# Patient Record
Sex: Female | Born: 2000 | Race: Black or African American | Hispanic: No | Smoking: Never smoker
Health system: Southern US, Community
[De-identification: ages and names within clinical notes are randomized; demographics above are authoritative.]

## PROBLEM LIST (undated history)

## (undated) DIAGNOSIS — Z9889 Other specified postprocedural states: Secondary | ICD-10-CM

## (undated) DIAGNOSIS — I471 Supraventricular tachycardia, unspecified: Secondary | ICD-10-CM

## (undated) DIAGNOSIS — M419 Scoliosis, unspecified: Secondary | ICD-10-CM

## (undated) HISTORY — PX: CARDIAC SURGERY: SHX584

## (undated) HISTORY — PX: RADIOFREQUENCY ABLATION: SHX2290

## (undated) HISTORY — DX: Scoliosis, unspecified: M41.9

---
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
## 2020-05-22 ENCOUNTER — Ambulatory Visit (INDEPENDENT_AMBULATORY_CARE_PROVIDER_SITE_OTHER): Payer: Medicaid Other | Admitting: Pediatrics

## 2020-05-29 ENCOUNTER — Encounter (INDEPENDENT_AMBULATORY_CARE_PROVIDER_SITE_OTHER): Payer: Self-pay | Admitting: Pediatrics

## 2020-05-29 ENCOUNTER — Ambulatory Visit (INDEPENDENT_AMBULATORY_CARE_PROVIDER_SITE_OTHER): Payer: Medicaid Other | Admitting: Pediatrics

## 2020-05-29 ENCOUNTER — Other Ambulatory Visit: Payer: Self-pay

## 2020-05-29 VITALS — BP 110/66 | HR 84 | Ht 64.72 in | Wt 114.0 lb

## 2020-05-29 DIAGNOSIS — E161 Other hypoglycemia: Secondary | ICD-10-CM | POA: Diagnosis not present

## 2020-05-29 DIAGNOSIS — N898 Other specified noninflammatory disorders of vagina: Secondary | ICD-10-CM | POA: Diagnosis not present

## 2020-05-29 DIAGNOSIS — Z8349 Family history of other endocrine, nutritional and metabolic diseases: Secondary | ICD-10-CM | POA: Diagnosis not present

## 2020-05-29 LAB — POCT GLYCOSYLATED HEMOGLOBIN (HGB A1C): Hemoglobin A1C: 5.1 % (ref 4.0–5.6)

## 2020-05-29 LAB — POCT GLUCOSE (DEVICE FOR HOME USE): Glucose Fasting, POC: 98 mg/dL (ref 70–99)

## 2020-05-29 NOTE — Patient Instructions (Addendum)
It was a pleasure to see you in clinic today.   Feel free to contact our office during normal business hours at [phone] with questions or concerns. If you need [place] urgently after normal business hours, please call the above number to reach our answering service who will contact the on-call pediatric endocrinologist.  If you choose to communicate with [place] via MyChart, please do not send urgent messages as this inbox is NOT monitored on nights or weekends.  Urgent concerns should be discussed with the on-call pediatric endocrinologist.   Call if you start having episodes again.  Otherwise make sure to eat small meals throughout the day.

## 2020-05-29 NOTE — Progress Notes (Signed)
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 female presenting for follow-up of the above concerns.  She attended this visit alone.  1. Sandra [name] initially [facility] to [facility] ([facility]) in 09/2017 for concerns of hypoglycemia with a syncopal episode.  Sandra [name] had been evaluated in [facility] emergency room on 08/14/2017 after fainting at a family reunion after a prolonged fast (episode occurred in the afternoon and she had not eaten since the night before). Mom attempted to feed Sandra [name] before she passed out, though was unsuccessful. She was taken to the emergency room by private vehicle and came to after smelling ammonia. CBG in the emergency room was 74, glucose as part of CMP was 94. UA was negative for glucose with 5 of ketones. The syncopal event was attributed to hypoglycemia due to poor by mouth intake prior to the family reunion. Sandra [name] was seen by her PCP on 08/26/2017 where weight was documented as 106 pounds, height documented at 5 feet 3.2 inches and A1c recorded as 5.7%.  At her initial visit to [facility]), she was given a glucometer and diet changes were recommended including eating protein with each meal and not skipping meals.    2.Since last visit on 11/21/2019, Sandra [name] has been well.     Concerning episodes: -None.  Has been eating well. Eats all the time at work [employer])  Diet review:  -Eating all the time at work.  No problems.  Good appetite.  Weight: increased 4lb from last visit.  Plotting at 25th%.  Activity:  -getting lots of activity at work  Energy: good  Going to [employer] in the Fall to study business  Has menstrual concern today- passed a clot vaginally 05/22/2020, since then has had yellow discharge.  No odor, no pain, no dysuria.  No recent  antibotics.  Advised to call PCP for further evaluation.  ROS:  All systems reviewed with pertinent positives listed below; otherwise negative. CV: history of SVT s/p accessory tract ablation in 2015 (Dr. Loa Socks at [facility]). No cardiac issues  Past Medical History:  Past Medical History:  Diagnosis Date  . History of Holter monitoring 10/2016   normal  . Scoliosis   . SVT (supraventricular tachycardia) (HCC)     Meds: Outpatient Encounter Medications as of 05/29/2020  Medication Sig  . medroxyPROGESTERone (DEPO-PROVERA) 150 MG/ML injection Inject 150 mg into the muscle every 3 (three) months.  . Accu-Chek Softclix Lancets lancets Use as instructed (Patient not taking: Reported on 05/29/2020)  . glucose blood (ACCU-CHEK GUIDE) test strip Use to check BG when having symptoms 3 times a day (Patient not taking: Reported on 05/29/2020)  . ibuprofen (ADVIL,MOTRIN) 400 MG tablet Take 1 tablet (400 mg total) by mouth every 6 (six) hours as needed. (Patient not taking: Reported on 05/29/2020)   No facility-administered encounter medications on file as of 05/29/2020.    Allergies: No Known Allergies  Surgical History: Past Surgical History:  Procedure Laterality Date  . 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 RADIOFREQUENCY ABLATION      Family History:  Family History  Problem Relation Age of Onset  . Cancer Mother        Thyroid cancer s/p thyroidectomy, Dx 2015  . Irritable bowel syndrome Mother   . Thyroid disease Mother  Thyroid cancer s/p thyroidectomy, treated with levothyroxine  . Sickle cell trait Father   . Schizophrenia Maternal Grandmother   . Schizophrenia Maternal Grandfather   Mother and older sister have presumed hypoglycemic episodes (mother describes hers as reactive hypoglycemia)  Maternal great-grandmother had diabetes treated with insulin  Social History: Lives with: Mother and younger brother  Graduated from [employer].  Working  at [employer].  Attending [employer] in the fall  Physical Exam:  Vitals:   05/29/20 1455  BP: 110/66  Pulse: 84  Weight: 114 lb (51.7 kg)  Height: 5' 4.72" (1.644 m)   BP 110/66   Pulse 84   Ht 5' 4.72" (1.644 m)   Wt 114 lb (51.7 kg)   BMI 19.13 kg/m  Body mass index: body mass index is 19.13 kg/m. Blood pressure percentiles are not available for patients who are 18 years or older.  Wt Readings from Last 3 Encounters:  05/29/20 114 lb (51.7 kg) (27 %, Z= -0.63)*  11/21/19 110 lb (49.9 kg) (20 %, Z= -0.83)*  07/19/19 106 lb (48.1 kg) (14 %, Z= -1.07)*   * Growth percentiles are based on CDC (Girls, 2-20 Years) data.   Ht Readings from Last 3 Encounters:  05/29/20 5' 4.72" (1.644 m) (57 %, Z= 0.19)*  11/21/19 5' 4.37" (1.635 m) (52 %, Z= 0.06)*  07/19/19 5' 4.17" (1.63 m) (50 %, Z= -0.01)*   * Growth percentiles are based on CDC (Girls, 2-20 Years) data.   Body mass index is 19.13 kg/m.  27 %ile (Z= -0.63) based on CDC (Girls, 2-20 Years) weight-for-age data using vitals from 05/29/2020. 57 %ile (Z= 0.19) based on CDC (Girls, 2-20 Years) Stature-for-age data based on Stature recorded on 05/29/2020.  General: Well developed, well nourished female in no acute distress.  Appears stated age Head: Normocephalic, atraumatic.   Eyes:  Pupils equal and round. EOMI.   Sclera white.  No eye drainage.   Ears/Nose/Mouth/Throat: Masked   Neck: supple, no cervical lymphadenopathy, no thyromegaly Cardiovascular: regular rate, normal S1/S2, no murmurs Respiratory: No increased work of breathing.  Lungs clear to auscultation bilaterally.  No wheezes. Abdomen: soft, nontender, nondistended.  Extremities: warm, well perfused, cap refill < 2 sec.   Musculoskeletal: Normal muscle mass.  Normal strength Skin: warm, dry.  No rash or lesions. Neurologic: alert and oriented, normal speech, no tremor  Laboratory Evaluation:   Ref. Range 11/21/2019 10:32  TSH Latest Units: mIU/L 1.75   T4,Free(Direct) Latest Ref Range: 0.8 - 1.4 ng/dL 1.3  Thyroxine (T4) Latest Ref Range: 5.3 - 11.7 mcg/dL 12.0 (H)     Ref. Range 05/29/2020 14:59  Hemoglobin A1C Latest Ref Range: 4.0 - 5.6 % 5.1  Glucose Fasting, POC Latest Ref Range: 70 - 99 mg/dL 98    Assessment/Plan:  Sandra [name] is a 19 y.o. female with past episode of syncope felt related to ketotic hypoglycemia/prolonged fasting with family history of reactive hypoglycemia. She has been doing better eating more recently and no recent episodes concerning for hypoglycemia.  She has had weight gain since last visit and is doing well.    1. Ketotic hypoglycemia/ 2. Family history of hypoglycemia -Encouraged to eat small meals with protein/carbs throughout the day. -Check BG prn -Growth chart reviewed with family -Explained that A1c/fasting glucose today is normal.   3. Family history of thyroid disease/ -TFTs normal at last visit.  No sx today.   4. Vaginal discharge -Advised to go to PCP for evaluation  Follow-up:  Return if symptoms worsen or fail to improve.  Call to be seen if episodes return   >30 minutes spent today reviewing the medical chart, counseling the patient/family, and documenting today's encounter.  Casimiro Needle, MD

## 2020-06-04 ENCOUNTER — Telehealth: Payer: Self-pay | Admitting: Adult Health

## 2020-06-04 NOTE — Telephone Encounter (Signed)

## 2020-06-05 ENCOUNTER — Encounter: Payer: Self-pay | Admitting: Adult Health

## 2020-06-05 ENCOUNTER — Ambulatory Visit (INDEPENDENT_AMBULATORY_CARE_PROVIDER_SITE_OTHER): Payer: Medicaid Other | Admitting: Adult Health

## 2020-06-05 VITALS — BP 111/71 | HR 81 | Ht 65.0 in | Wt 116.0 lb

## 2020-06-05 DIAGNOSIS — Z113 Encounter for screening for infections with a predominantly sexual mode of transmission: Secondary | ICD-10-CM | POA: Diagnosis not present

## 2020-06-05 DIAGNOSIS — N760 Acute vaginitis: Secondary | ICD-10-CM

## 2020-06-05 DIAGNOSIS — N898 Other specified noninflammatory disorders of vagina: Secondary | ICD-10-CM | POA: Diagnosis not present

## 2020-06-05 DIAGNOSIS — B9689 Other specified bacterial agents as the cause of diseases classified elsewhere: Secondary | ICD-10-CM | POA: Insufficient documentation

## 2020-06-05 LAB — POCT WET PREP (WET MOUNT)
Clue Cells Wet Prep Whiff POC: NEGATIVE
WBC, Wet Prep HPF POC: POSITIVE

## 2020-06-05 MED ORDER — METRONIDAZOLE 500 MG PO TABS
500.0000 mg | ORAL_TABLET | Freq: Two times a day (BID) | ORAL | 0 refills | Status: DC
Start: 2020-06-05 — End: 2021-05-15

## 2020-06-05 NOTE — Patient Instructions (Signed)
Bacterial Vaginosis  Bacterial vaginosis is a vaginal infection that occurs when the normal balance of bacteria in the vagina is disrupted. It results from an overgrowth of certain bacteria. This is the most common vaginal infection among women ages 15-44. Because bacterial vaginosis increases your risk for STIs (sexually transmitted infections), getting treated can help reduce your risk for chlamydia, gonorrhea, herpes, and HIV (human immunodeficiency virus). Treatment is also important for preventing complications in pregnant women, because this condition can cause an early (premature) delivery. What are the causes? This condition is caused by an increase in harmful bacteria that are normally present in small amounts in the vagina. However, the reason that the condition develops is not fully understood. What increases the risk? The following factors may make you more likely to develop this condition:  Having a new sexual partner or multiple sexual partners.  Having unprotected sex.  Douching.  Having an intrauterine device (IUD).  Smoking.  Drug and alcohol abuse.  Taking certain antibiotic medicines.  Being pregnant. You cannot get bacterial vaginosis from toilet seats, bedding, swimming pools, or contact with objects around you. What are the signs or symptoms? Symptoms of this condition include:  Grey or white vaginal discharge. The discharge can also be watery or foamy.  A fish-like odor with discharge, especially after sexual intercourse or during menstruation.  Itching in and around the vagina.  Burning or pain with urination. Some women with bacterial vaginosis have no signs or symptoms. How is this diagnosed? This condition is diagnosed based on:  Your medical history.  A physical exam of the vagina.  Testing a sample of vaginal fluid under a microscope to look for a large amount of bad bacteria or abnormal cells. Your health care provider may use a cotton swab or  a small wooden spatula to collect the sample. How is this treated? This condition is treated with antibiotics. These may be given as a pill, a vaginal cream, or a medicine that is put into the vagina (suppository). If the condition comes back after treatment, a second round of antibiotics may be needed. Follow these instructions at home: Medicines  Take over-the-counter and prescription medicines only as told by your health care provider.  Take or use your antibiotic as told by your health care provider. Do not stop taking or using the antibiotic even if you start to feel better. General instructions  If you have a female sexual partner, tell her that you have a vaginal infection. She should see her health care provider and be treated if she has symptoms. If you have a female sexual partner, he does not need treatment.  During treatment: ? Avoid sexual activity until you finish treatment. ? Do not douche. ? Avoid alcohol as directed by your health care provider. ? Avoid breastfeeding as directed by your health care provider.  Drink enough water and fluids to keep your urine clear or pale yellow.  Keep the area around your vagina and rectum clean. ? Wash the area daily with warm water. ? Wipe yourself from front to back after using the toilet.  Keep all follow-up visits as told by your health care provider. This is important. How is this prevented?  Do not douche.  Wash the outside of your vagina with warm water only.  Use protection when having sex. This includes latex condoms and dental dams.  Limit how many sexual partners you have. To help prevent bacterial vaginosis, it is best to have sex with just one partner (  monogamous).  Make sure you and your sexual partner are tested for STIs.  Wear cotton or cotton-lined underwear.  Avoid wearing tight pants and pantyhose, especially during summer.  Limit the amount of alcohol that you drink.  Do not use any products that contain  nicotine or tobacco, such as cigarettes and e-cigarettes. If you need help quitting, ask your health care provider.  Do not use illegal drugs. Where to find more information  Centers for Disease Control and Prevention: www.cdc.gov/std  American Sexual Health Association (ASHA): www.ashastd.org  U.S. Department of Health and Human Services, Office on Women's Health: www.womenshealth.gov/ or https://www.womenshealth.gov/a-z-topics/bacterial-vaginosis Contact a health care provider if:  Your symptoms do not improve, even after treatment.  You have more discharge or pain when urinating.  You have a fever.  You have pain in your abdomen.  You have pain during sex.  You have vaginal bleeding between periods. Summary  Bacterial vaginosis is a vaginal infection that occurs when the normal balance of bacteria in the vagina is disrupted.  Because bacterial vaginosis increases your risk for STIs (sexually transmitted infections), getting treated can help reduce your risk for chlamydia, gonorrhea, herpes, and HIV (human immunodeficiency virus). Treatment is also important for preventing complications in pregnant women, because the condition can cause an early (premature) delivery.  This condition is treated with antibiotic medicines. These may be given as a pill, a vaginal cream, or a medicine that is put into the vagina (suppository). This information is not intended to replace advice given to you by your health care provider. Make sure you discuss any questions you have with your health care provider. Document Revised: 11/12/2017 Document Reviewed: 08/15/2016 Elsevier Patient Education  2020 Elsevier Inc.  

## 2020-06-05 NOTE — Progress Notes (Signed)
  Subjective:     Patient ID: Sandra [name], female   DOB: 2001-05-26, 19 y.o.   MRN: [id number]  HPI Flora is a 19 year old black female,single, G0P0, on depo, complaining of vaginal discharge.She does not have bleeding with depo, but passed a clot on 05/22/20 an has had vaginal discharge since, her last depo was 05/30/20, she got at [place], but wants to start getting depo here. She graduated from [employer] and is going to [employer] in the fall.  PCP is Terie Purser PA.  Review of Systems On depo, no periods, but passed clot 05/22/20 and has had vaginal discharge since Denies any itching or burning, no odor Not currently sexually active.  Reviewed past medical,surgical, social and family history. Reviewed medications and allergies.      Objective:   Physical Exam BP 111/71 (BP Location: Left Arm, Patient Position: Sitting, Cuff Size: Normal)   Pulse 81   Ht 5\' 5"  (1.651 m)   Wt 116 lb (52.6 kg)   BMI 19.30 kg/m  Skin warm and dry.  Lungs: clear to ausculation bilaterally. Cardiovascular: regular rate and rhythm. Pelvic: external genitalia is normal in appearance no lesions, vagina: copious greenish discharge without odor,urethra has no lesions or masses noted, cervix:smooth and nulliparous, uterus: normal size, shape and contour, non tender, no masses felt, adnexa: no masses or tenderness noted. Bladder is non tender and no masses felt. Wet prep: + for clue cells and +WBCs. Nuswab obtained AA 0 Fall risk is low PHQ 9 score is 0 Examination chaperoned by LPN    Assessment:     1. Vaginal discharge nuswab sent  2. BV (bacterial vaginosis) nuswab sent Will rx flagyl, no sex or alcohol during treatment  Meds ordered this encounter  Medications  . metroNIDAZOLE (FLAGYL) 500 MG tablet    Sig: Take 1 tablet (500 mg total) by mouth 2 (two) times daily.    Dispense:  14 tablet    Refill:  0    Order Specific Question:   Supervising Provider    Answer:   Faith Rogue [[id number]]  Review handout on BV  3. Screening examination for STD (sexually transmitted disease) Nuswab sent    Plan:     Depo due in September

## 2020-06-08 LAB — NUSWAB VAGINITIS PLUS (VG+)
Candida albicans, NAA: NEGATIVE
Candida glabrata, NAA: NEGATIVE
Chlamydia trachomatis, NAA: NEGATIVE
Neisseria gonorrhoeae, NAA: POSITIVE — AB
Trich vag by NAA: NEGATIVE

## 2020-06-10 ENCOUNTER — Telehealth: Payer: Self-pay | Admitting: Adult Health

## 2020-06-10 ENCOUNTER — Encounter: Payer: Self-pay | Admitting: Adult Health

## 2020-06-10 DIAGNOSIS — A549 Gonococcal infection, unspecified: Secondary | ICD-10-CM

## 2020-06-10 HISTORY — DX: Gonococcal infection, unspecified: A54.9

## 2020-06-10 NOTE — Telephone Encounter (Signed)
Left message with mom to have Brissia  call me

## 2020-06-10 NOTE — Telephone Encounter (Signed)
Pt aware nuswab +GC, to come in am at 9:30 for injection of Rocephin 500 mg,tell partner he needs to be treated, no sex and POC in 2 weeks or so, [facility] sent

## 2020-06-11 ENCOUNTER — Encounter: Payer: Self-pay | Admitting: *Deleted

## 2020-06-11 ENCOUNTER — Ambulatory Visit (INDEPENDENT_AMBULATORY_CARE_PROVIDER_SITE_OTHER): Payer: Medicaid Other | Admitting: *Deleted

## 2020-06-11 DIAGNOSIS — A549 Gonococcal infection, unspecified: Secondary | ICD-10-CM | POA: Diagnosis not present

## 2020-06-11 MED ORDER — CEFTRIAXONE SODIUM 250 MG IJ SOLR
500.0000 mg | Freq: Once | INTRAMUSCULAR | Status: AC
  Administered 2020-06-11: 10:00:00 500 mg via INTRAMUSCULAR

## 2020-06-11 MED ORDER — CEFTRIAXONE SODIUM 250 MG IJ SOLR: 250.0000 mg | Freq: Once | INTRAMUSCULAR | Status: DC

## 2020-06-11 NOTE — Progress Notes (Signed)
   NURSE VISIT- INJECTION  SUBJECTIVE:  Sandra [name] is a 19 y.o. G0P0000 female here for a Rocephin for treatment for gonorrhea. She is a GYN patient.   OBJECTIVE:  There were no vitals taken for this visit.  Appears well, in no apparent distress  Injection administered in: Right upper quad. gluteus  Meds ordered this encounter  Medications  . DISCONTD: cefTRIAXone (ROCEPHIN) injection 250 mg  . cefTRIAXone (ROCEPHIN) injection 500 mg    ASSESSMENT: GYN patient Rocephin for treatment for gonorrhea PLAN: Follow-up: as scheduled   Malachy Mood  06/11/2020 10:32 AM

## 2020-07-09 ENCOUNTER — Other Ambulatory Visit (INDEPENDENT_AMBULATORY_CARE_PROVIDER_SITE_OTHER): Payer: Medicaid Other | Admitting: *Deleted

## 2020-07-09 ENCOUNTER — Other Ambulatory Visit (HOSPITAL_COMMUNITY)
Admission: RE | Admit: 2020-07-09 | Discharge: 2020-07-09 | Disposition: A | Discharge status: Home / Self Care | Payer: Medicaid Other | Source: Ambulatory Visit | Attending: Obstetrics & Gynecology | Admitting: Obstetrics & Gynecology

## 2020-07-09 DIAGNOSIS — Z113 Encounter for screening for infections with a predominantly sexual mode of transmission: Secondary | ICD-10-CM | POA: Diagnosis not present

## 2020-07-09 DIAGNOSIS — A549 Gonococcal infection, unspecified: Secondary | ICD-10-CM

## 2020-07-09 NOTE — Progress Notes (Signed)
   NURSE VISIT- STD/POC  SUBJECTIVE:  Sandra [name] is a 19 y.o. G0P0000 GYN patientfemale here for a vaginal swab for STD screen.  She reports the following symptoms: none for 0 days. Denies abnormal vaginal bleeding, significant pelvic pain, fever, or UTI symptoms.  OBJECTIVE:  There were no vitals taken for this visit.  Appears well, in no apparent distress  ASSESSMENT: Vaginal swab for STD screen  PLAN: Self-collected vaginal probe for Gonorrhea, Chlamydia sent to lab Treatment: to be determined once results are received Follow-up as needed if symptoms persist/worsen, or new symptoms develop  Malachy Mood  07/09/2020 11:28 AM

## 2020-07-09 NOTE — Progress Notes (Signed)
Chart reviewed for nurse visit. Agree with plan of care.  Nejla Reasor A, NP 07/09/2020 12:56 PM  

## 2020-07-10 ENCOUNTER — Telehealth: Payer: Self-pay | Admitting: *Deleted

## 2020-07-10 NOTE — Telephone Encounter (Signed)
[facility] called to inform us that the swab needed to be recollected due to not having enough fluid with the sample.  Patient notified and patient will come tomorrow for recollection.

## 2020-07-11 ENCOUNTER — Other Ambulatory Visit (INDEPENDENT_AMBULATORY_CARE_PROVIDER_SITE_OTHER): Payer: Medicaid Other | Admitting: *Deleted

## 2020-07-11 ENCOUNTER — Other Ambulatory Visit (HOSPITAL_COMMUNITY)
Admission: RE | Admit: 2020-07-11 | Discharge: 2020-07-11 | Disposition: A | Discharge status: Home / Self Care | Payer: Medicaid Other | Source: Ambulatory Visit | Attending: Obstetrics and Gynecology | Admitting: Obstetrics and Gynecology

## 2020-07-11 DIAGNOSIS — A549 Gonococcal infection, unspecified: Secondary | ICD-10-CM | POA: Diagnosis present

## 2020-07-11 NOTE — Progress Notes (Signed)
Chart reviewed for nurse visit. Agree with plan of care.  Cyril Mourning A, NP 07/11/2020 1:25 PM

## 2020-07-11 NOTE — Progress Notes (Signed)
   NURSE VISIT- POC  SUBJECTIVE:  Sandra [name] is a 19 y.o. G0P0000 GYN patientfemale here for a vaginal swab for proof of cure after treatment for Gonorrhea. Initial recollection was unable to be tested with pathology due to lack of fluid in container.  She reports the following symptoms: none for 0 days. Denies abnormal vaginal bleeding, significant pelvic pain, fever, or UTI symptoms.  OBJECTIVE:  There were no vitals taken for this visit.  Appears well, in no apparent distress  ASSESSMENT: Vaginal swab for proof of cure after treatment for Gonorrhea  PLAN: Self-collected vaginal probe for Gonorrhea, Chlamydia, Trichomonas sent to lab Treatment: to be determined once results are received Follow-up as needed if symptoms persist/worsen, or new symptoms develop  Jobe Marker  07/11/2020 10:25 AM

## 2020-07-16 LAB — CERVICOVAGINAL ANCILLARY ONLY
Chlamydia: NEGATIVE
Comment: NEGATIVE
Comment: NEGATIVE
Comment: NORMAL
Neisseria Gonorrhea: NEGATIVE
Trichomonas: NEGATIVE

## 2020-07-31 ENCOUNTER — Other Ambulatory Visit: Payer: Self-pay

## 2020-07-31 ENCOUNTER — Ambulatory Visit (INDEPENDENT_AMBULATORY_CARE_PROVIDER_SITE_OTHER): Payer: Medicaid Other | Admitting: *Deleted

## 2020-07-31 DIAGNOSIS — Z3042 Encounter for surveillance of injectable contraceptive: Secondary | ICD-10-CM

## 2020-07-31 MED ORDER — MEDROXYPROGESTERONE ACETATE 150 MG/ML IM SUSP
150.0000 mg | Freq: Once | INTRAMUSCULAR | Status: AC
  Administered 2020-07-31: 150 mg via INTRAMUSCULAR

## 2020-07-31 NOTE — Progress Notes (Signed)
° °  NURSE VISIT- INJECTION  SUBJECTIVE:  Sandra [name] is a 19 y.o. G0P0000 female here for a Depo Provera for contraception/period management. She is a GYN patient.   OBJECTIVE:  There were no vitals taken for this visit.  Appears well, in no apparent distress  Injection administered in: Right upper quad. gluteus  Meds ordered this encounter  Medications   medroxyPROGESTERone (DEPO-PROVERA) injection 150 mg    ASSESSMENT: GYN patient Depo Provera for contraception/period management PLAN: Follow-up: in 11-13 weeks for next Depo   Sandra [name]  07/31/2020 10:26 AM

## 2020-08-26 ENCOUNTER — Encounter (HOSPITAL_COMMUNITY): Payer: Self-pay

## 2020-08-26 ENCOUNTER — Emergency Department (HOSPITAL_COMMUNITY)
Admission: EM | Admit: 2020-08-26 | Discharge: 2020-08-27 | Disposition: A | Discharge status: Home / Self Care | Payer: Medicaid Other | Attending: Emergency Medicine | Admitting: Emergency Medicine

## 2020-08-26 DIAGNOSIS — Z5321 Procedure and treatment not carried out due to patient leaving prior to being seen by health care provider: Secondary | ICD-10-CM | POA: Insufficient documentation

## 2020-08-26 DIAGNOSIS — R112 Nausea with vomiting, unspecified: Secondary | ICD-10-CM | POA: Insufficient documentation

## 2020-08-26 DIAGNOSIS — R55 Syncope and collapse: Secondary | ICD-10-CM | POA: Diagnosis not present

## 2020-08-26 LAB — CBC
HCT: 40.4 % (ref 36.0–46.0)
Hemoglobin: 13.5 g/dL (ref 12.0–15.0)
MCH: 29.2 pg (ref 26.0–34.0)
MCHC: 33.4 g/dL (ref 30.0–36.0)
MCV: 87.3 fL (ref 80.0–100.0)
Platelets: 221 10*3/uL (ref 150–400)
RBC: 4.63 MIL/uL (ref 3.87–5.11)
RDW: 13 % (ref 11.5–15.5)
WBC: 4.5 10*3/uL (ref 4.0–10.5)
nRBC: 0 % (ref 0.0–0.2)

## 2020-08-26 LAB — BASIC METABOLIC PANEL
Anion gap: 10 (ref 5–15)
BUN: 7 mg/dL (ref 6–20)
CO2: 24 mmol/L (ref 22–32)
Calcium: 9.6 mg/dL (ref 8.9–10.3)
Chloride: 105 mmol/L (ref 98–111)
Creatinine, Ser: 0.94 mg/dL (ref 0.44–1.00)
GFR calc Af Amer: >60 mL/min (ref >60)
GFR calc non Af Amer: >60 mL/min (ref >60)
Glucose, Bld: 86 mg/dL (ref 70–99)
Potassium: 3.6 mmol/L (ref 3.5–5.1)
Sodium: 139 mmol/L (ref 135–145)

## 2020-08-26 LAB — I-STAT BETA HCG BLOOD, ED (MC, WL, AP ONLY): I-stat hCG, quantitative: <5 m[IU]/mL (ref <5)

## 2020-08-26 NOTE — ED Triage Notes (Signed)
Pt arrives to ED via [facility] w/ c/o near syncope while at [facility]. Pt had episode of n/v, however, feels better on arrival to ED.

## 2020-08-26 NOTE — ED Notes (Signed)
Pt's mother wanting to take her to [facility] ED as that is closer to home and her son can be looked after rather than waiting outside.  Advised mother and patient of vitals and that we would recommend that she stay but if she chooses to leave to come back or call 911 if patient begins to feel worse.  Patient and mother voiced understanding and have chosen to leave at this time.

## 2020-08-27 ENCOUNTER — Telehealth (INDEPENDENT_AMBULATORY_CARE_PROVIDER_SITE_OTHER): Payer: Self-pay | Admitting: Pediatrics

## 2020-08-27 NOTE — Telephone Encounter (Signed)
Advised patient to contact on call provider, Dr. Fransico Michael, between 7-9:30 PM.   Thank you for involving clinical pharmacist/diabetes educator to assist in providing this patient's care.   Zachery Conch, PharmD, CPP

## 2020-08-27 NOTE — Telephone Encounter (Signed)
°  Who's calling (name and relationship to patient) : Domenica Fail (mom)  Best contact number: [phone]  Provider they see: Dr. Larinda Buttery  Reason for call: Mom states that patient's blood sugar and blood pressure have been getting too low - yesterday EMS had to come and check her out. Mom requests call back.    PRESCRIPTION REFILL ONLY  Name of prescription:  Pharmacy:

## 2020-10-23 ENCOUNTER — Other Ambulatory Visit: Payer: Self-pay

## 2020-10-23 ENCOUNTER — Ambulatory Visit (INDEPENDENT_AMBULATORY_CARE_PROVIDER_SITE_OTHER): Payer: Medicaid Other | Admitting: *Deleted

## 2020-10-23 DIAGNOSIS — Z3042 Encounter for surveillance of injectable contraceptive: Secondary | ICD-10-CM

## 2020-10-23 MED ORDER — MEDROXYPROGESTERONE ACETATE 150 MG/ML IM SUSP
150.0000 mg | Freq: Once | INTRAMUSCULAR | Status: AC
  Administered 2020-10-23: 150 mg via INTRAMUSCULAR

## 2020-10-23 NOTE — Progress Notes (Signed)
   NURSE VISIT- INJECTION  SUBJECTIVE:  Sandra [name] is a 19 y.o. G0P0000 female here for a Depo Provera for contraception/period management. She is a GYN patient.   OBJECTIVE:  There were no vitals taken for this visit.  Appears well, in no apparent distress  Injection administered in: Left upper quad. gluteus  No orders of the defined types were placed in this encounter.   ASSESSMENT: GYN patient Depo Provera for contraception/period management PLAN: Follow-up: in 11-13 weeks for next Depo   Annamarie Dawley  10/23/2020 10:33 AM

## 2021-01-08 ENCOUNTER — Ambulatory Visit (INDEPENDENT_AMBULATORY_CARE_PROVIDER_SITE_OTHER): Payer: Medicaid Other

## 2021-01-08 ENCOUNTER — Other Ambulatory Visit: Payer: Self-pay

## 2021-01-08 DIAGNOSIS — Z3042 Encounter for surveillance of injectable contraceptive: Secondary | ICD-10-CM

## 2021-01-08 MED ORDER — MEDROXYPROGESTERONE ACETATE 150 MG/ML IM SUSY: PREFILLED_SYRINGE | Freq: Once | INTRAMUSCULAR | Status: AC

## 2021-01-08 NOTE — Progress Notes (Signed)
   NURSE VISIT- INJECTION  SUBJECTIVE:  Sandra [name] is a 20 y.o. G0P0000 female here for a Depo Provera for contraception/period management. She is a GYN patient.   OBJECTIVE:  There were no vitals taken for this visit.  Appears well, in no apparent distress  Injection administered in: Right upper quad. gluteus  Meds ordered this encounter  Medications  . medroxyPROGESTERone Acetate SUSY    ASSESSMENT: GYN patient Depo Provera for contraception/period management PLAN: Follow-up: in 11-13 weeks for next Depo   Jolane Bankhead A Noelani Harbach  01/08/2021 10:15 AM

## 2021-04-02 ENCOUNTER — Ambulatory Visit (INDEPENDENT_AMBULATORY_CARE_PROVIDER_SITE_OTHER): Payer: Medicaid Other

## 2021-04-02 ENCOUNTER — Other Ambulatory Visit: Payer: Self-pay

## 2021-04-02 DIAGNOSIS — Z3042 Encounter for surveillance of injectable contraceptive: Secondary | ICD-10-CM | POA: Diagnosis not present

## 2021-04-02 MED ORDER — MEDROXYPROGESTERONE ACETATE 150 MG/ML IM SUSY: PREFILLED_SYRINGE | Freq: Once | INTRAMUSCULAR | Status: AC

## 2021-04-02 NOTE — Progress Notes (Signed)
   NURSE VISIT- INJECTION  SUBJECTIVE:  Sandra [name] is a 20 y.o. G0P0000 female here for a Depo Provera for contraception/period management. She is a GYN patient.   OBJECTIVE:  There were no vitals taken for this visit.  Appears well, in no apparent distress  Injection administered in: Right upper quad. gluteus  Meds ordered this encounter  Medications  . medroxyPROGESTERone Acetate SUSY    ASSESSMENT: GYN patient Depo Provera for contraception/period management PLAN: Follow-up: in 11-13 weeks for next Depo   Sandra [name] A Sandra [name]  04/02/2021 9:43 AM

## 2021-04-16 ENCOUNTER — Emergency Department (HOSPITAL_COMMUNITY)
Admission: EM | Admit: 2021-04-16 | Discharge: 2021-04-16 | Disposition: A | Discharge status: Home / Self Care | Payer: Medicaid Other | Attending: Emergency Medicine | Admitting: Emergency Medicine

## 2021-04-16 ENCOUNTER — Emergency Department (HOSPITAL_COMMUNITY): Payer: Medicaid Other

## 2021-04-16 ENCOUNTER — Encounter (HOSPITAL_COMMUNITY): Payer: Self-pay

## 2021-04-16 ENCOUNTER — Other Ambulatory Visit: Payer: Self-pay

## 2021-04-16 DIAGNOSIS — M542 Cervicalgia: Secondary | ICD-10-CM | POA: Diagnosis not present

## 2021-04-16 LAB — CBC WITH DIFFERENTIAL/PLATELET
Abs Immature Granulocytes: 0.02 10*3/uL (ref 0.00–0.07)
Basophils Absolute: 0 10*3/uL (ref 0.0–0.1)
Basophils Relative: 1 %
Eosinophils Absolute: 0.1 10*3/uL (ref 0.0–0.5)
Eosinophils Relative: 1 %
HCT: 42.6 % (ref 36.0–46.0)
Hemoglobin: 14 g/dL (ref 12.0–15.0)
Immature Granulocytes: 0 %
Lymphocytes Relative: 19 %
Lymphs Abs: 1.2 10*3/uL (ref 0.7–4.0)
MCH: 28.8 pg (ref 26.0–34.0)
MCHC: 32.9 g/dL (ref 30.0–36.0)
MCV: 87.7 fL (ref 80.0–100.0)
Monocytes Absolute: 0.6 10*3/uL (ref 0.1–1.0)
Monocytes Relative: 9 %
Neutro Abs: 4.3 10*3/uL (ref 1.7–7.7)
Neutrophils Relative %: 70 %
Platelets: 273 10*3/uL (ref 150–400)
RBC: 4.86 MIL/uL (ref 3.87–5.11)
RDW: 13 % (ref 11.5–15.5)
WBC: 6.1 10*3/uL (ref 4.0–10.5)
nRBC: 0 % (ref 0.0–0.2)

## 2021-04-16 LAB — COMPREHENSIVE METABOLIC PANEL
ALT: 26 U/L (ref 0–44)
AST: 26 U/L (ref 15–41)
Albumin: 4.7 g/dL (ref 3.5–5.0)
Alkaline Phosphatase: 55 U/L (ref 38–126)
Anion gap: 9 (ref 5–15)
BUN: 12 mg/dL (ref 6–20)
CO2: 22 mmol/L (ref 22–32)
Calcium: 9.5 mg/dL (ref 8.9–10.3)
Chloride: 106 mmol/L (ref 98–111)
Creatinine, Ser: 0.61 mg/dL (ref 0.44–1.00)
GFR, Estimated: >60 mL/min (ref >60)
Glucose, Bld: 94 mg/dL (ref 70–99)
Potassium: 4.3 mmol/L (ref 3.5–5.1)
Sodium: 137 mmol/L (ref 135–145)
Total Bilirubin: 0.6 mg/dL (ref 0.3–1.2)
Total Protein: 8.8 g/dL — ABNORMAL HIGH (ref 6.5–8.1)

## 2021-04-16 LAB — I-STAT BETA HCG BLOOD, ED (MC, WL, AP ONLY): I-stat hCG, quantitative: <5 m[IU]/mL (ref <5)

## 2021-04-16 LAB — D-DIMER, QUANTITATIVE: D-Dimer, Quant: 1.49 ug/mL-FEU — ABNORMAL HIGH (ref 0.00–0.50)

## 2021-04-16 MED ORDER — IOHEXOL 350 MG/ML SOLN
75.0000 mL | Freq: Once | INTRAVENOUS | Status: AC | PRN
  Administered 2021-04-16: 75 mL via INTRAVENOUS

## 2021-04-16 MED ORDER — IBUPROFEN 800 MG PO TABS: 800.0000 mg | ORAL_TABLET | Freq: Three times a day (TID) | ORAL | 0 refills | Status: DC | PRN

## 2021-04-16 NOTE — ED Triage Notes (Signed)
Patient presents to ED with complaints of neck pain since waking up this morning and states she had some left arm weakness. Pt states pain is not as bad as it was since waking up. Pt states hurting in left side of neck when she takes a deep breath.

## 2021-04-16 NOTE — Discharge Instructions (Signed)
Follow-up with your family doctor next week for recheck. 

## 2021-04-16 NOTE — ED Provider Notes (Signed)
[facility] EMERGENCY DEPARTMENT Provider Note   CSN: [id number] Arrival date & time: 04/16/21  8144     History Chief Complaint  Patient presents with  . Neck Pain    Sandra [name] is a 20 y.o. female.  Patient complains of left-sided neck pain.  The pain is worse when he takes a deep breath.  Mild discomfort with movement  The history is provided by medical records.  Neck Pain Pain location:  L side Quality:  Aching Pain radiates to:  Does not radiate Pain severity:  Mild Onset quality:  Sudden Timing:  Intermittent Progression:  Waxing and waning Chronicity:  New Context: not fall   Relieved by:  Nothing Associated symptoms: no chest pain and no headaches        Past Medical History:  Diagnosis Date  . GC (gonococcus infection) 06/10/2020  . History of Holter monitoring 10/2016   normal  . Scoliosis   . SVT (supraventricular tachycardia) [facility])     Patient Active Problem List   Diagnosis Date Noted  . GC (gonococcus infection) 06/10/2020  . BV (bacterial vaginosis) 06/05/2020  . Vaginal discharge 06/05/2020  . Screening examination for STD (sexually transmitted disease) 06/05/2020  . 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 Family history of hypoglycemia 09/29/2018  . SVT (supraventricular tachycardia) (HCC) 05/09/2015  . Palpitations 11/06/2014    Past Surgical History:  Procedure Laterality Date  . 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 RADIOFREQUENCY ABLATION       OB History    Gravida  0   Para  0   Term  0   Preterm  0   AB  0   Living  0     SAB  0   IAB  0   Ectopic  0   Multiple  0   Live Births  0           Family History  Problem Relation Age of Onset  . Cancer Mother        Thyroid cancer s/p thyroidectomy, Dx 2015  . Irritable bowel syndrome Mother   . Thyroid disease Mother        Thyroid cancer s/p thyroidectomy, treated with levothyroxine  . Sickle cell trait  Father   . Schizophrenia Maternal Grandmother   . Schizophrenia Maternal Grandfather     Social History   Tobacco Use  . Smoking status: Never Smoker  . Smokeless tobacco: Never Used  Vaping Use  . Vaping Use: Never used  Substance Use Topics  . Alcohol use: No  . Drug use: Yes    Types: Marijuana    Home Medications Prior to Admission medications   Medication Sig Start Date End Date Taking? Authorizing Provider  ibuprofen (ADVIL) 800 MG tablet Take 1 tablet (800 mg total) by mouth every 8 (eight) hours as needed for moderate pain. 04/16/21  Yes Bethann Berkshire, MD  medroxyPROGESTERone (DEPO-PROVERA) 150 MG/ML injection Inject 150 mg into the muscle every 3 (three) months.   Yes [provider]  metroNIDAZOLE (FLAGYL) 500 MG tablet Take 1 tablet (500 mg total) by mouth 2 (two) times daily. Patient not taking: Reported on 04/16/2021 06/05/20   Adline Potter, NP    Allergies    Patient has no known allergies.  Review of Systems   Review of Systems  Constitutional: Negative for appetite change and fatigue.  HENT: Negative for congestion, ear discharge and sinus  pressure.   Eyes: Negative for discharge.  Respiratory: Negative for cough.   Cardiovascular: Negative for chest pain.  Gastrointestinal: Negative for abdominal pain and diarrhea.  Genitourinary: Negative for frequency and hematuria.  Musculoskeletal: Positive for neck pain. Negative for back pain.  Skin: Negative for rash.  Neurological: Negative for seizures and headaches.  Psychiatric/Behavioral: Negative for hallucinations.    Physical Exam Updated Vital Signs BP 112/77 (BP Location: Right Arm)   Pulse 80   Temp 98.2 F (36.8 C) (Oral)   Resp 16   Ht 5\' 4"  (1.626 m)   Wt 56.7 kg   SpO2 100%   BMI 21.46 kg/m   Physical Exam Vitals and nursing note reviewed.  Constitutional:      Appearance: She is well-developed.  HENT:     Head: Normocephalic.     Nose: Nose normal.  Eyes:      General: No scleral icterus.    Conjunctiva/sclera: Conjunctivae normal.  Neck:     Thyroid: No thyromegaly.     Comments: Tender left lateral neck Cardiovascular:     Rate and Rhythm: Normal rate and regular rhythm.     Heart sounds: No murmur heard. No friction rub. No gallop.   Pulmonary:     Breath sounds: No stridor. No wheezing or rales.  Chest:     Chest wall: No tenderness.  Abdominal:     General: There is no distension.     Tenderness: There is no abdominal tenderness. There is no rebound.  Musculoskeletal:        General: Normal range of motion.     Cervical back: Neck supple.  Lymphadenopathy:     Cervical: No cervical adenopathy.  Skin:    Findings: No erythema or rash.  Neurological:     Mental Status: She is alert and oriented to person, place, and time.     Motor: No abnormal muscle tone.     Coordination: Coordination normal.  Psychiatric:        Behavior: Behavior normal.     ED Results / Procedures / Treatments   Labs (all labs ordered are listed, but only abnormal results are displayed) Labs Reviewed  COMPREHENSIVE METABOLIC PANEL - Abnormal; Notable for the following components:      Result Value   Total Protein 8.8 (*)    All other components within normal limits  D-DIMER, QUANTITATIVE - Abnormal; Notable for the following components:   D-Dimer, Quant 1.49 (*)    All other components within normal limits  CBC WITH DIFFERENTIAL/PLATELET  I-STAT BETA HCG BLOOD, ED ([facility], [facility], [facility] ONLY)    EKG None  Radiology DG Chest 2 View  Result Date: 04/16/2021 CLINICAL DATA:  Shortness of breath. Left-sided neck pain with radiation to left chest. EXAM: CHEST - 2 VIEW COMPARISON:  03/15/2010. FINDINGS: Mediastinum and hilar structures normal. Heart size normal. No focal infiltrate. No pleural effusion or pneumothorax. Thoracic spine scoliosis. IMPRESSION: No acute cardiopulmonary disease. Electronically Signed   By: 05/15/2010  Register   On: 04/16/2021 08:03   CT  Angio Chest PE W and/or Wo Contrast  Result Date: 04/16/2021 CLINICAL DATA:  High probability for pulmonary embolism EXAM: CT ANGIOGRAPHY CHEST WITH CONTRAST TECHNIQUE: Multidetector CT imaging of the chest was performed using the standard protocol during bolus administration of intravenous contrast. Multiplanar CT image reconstructions and MIPs were obtained to evaluate the vascular anatomy. CONTRAST:  109mL OMNIPAQUE IOHEXOL 350 MG/ML SOLN COMPARISON:  None. FINDINGS: Cardiovascular: Satisfactory opacification of the pulmonary arteries  to the segmental level. No evidence of pulmonary embolism. Normal heart size. No pericardial effusion. Mediastinum/Nodes: No mediastinal adenopathy or mass. Symmetric prominence of axillary lymph nodes with benign/reactive pattern. Lungs/Pleura: Mild atelectasis along the left heart border. Upper Abdomen: Negative Musculoskeletal: Pectus excavatum and mild scoliosis. No acute or aggressive finding. Review of the MIP images confirms the above findings. IMPRESSION: 1. Negative for pulmonary embolism. 2. Retrocardiac atelectasis. Electronically Signed   By: Marnee Spring M.D.   On: 04/16/2021 10:41    Procedures Procedures   Medications Ordered in ED Medications  iohexol (OMNIPAQUE) 350 MG/ML injection 75 mL (75 mLs Intravenous Contrast Given 04/16/21 0959)    ED Course  I have reviewed the triage vital signs and the nursing notes.  Pertinent labs & imaging results that were available during my care of the patient were reviewed by me and considered in my medical decision making (see chart for details). Labs unremarkable except for elevated D-dimer.  CT angio was negative.  Suspect musculoskeletal discomfort and left neck   MDM Rules/Calculators/A&P                         Patient with left-sided neck pain most likely from musculoskeletal discomfort.  She given Motrin will follow-up Final Clinical Impression(s) / ED Diagnoses Final diagnoses:  Neck pain    Rx /  DC Orders ED Discharge Orders         Ordered    ibuprofen (ADVIL) 800 MG tablet  Every 8 hours PRN        04/16/21 1152           Bethann Berkshire, MD 04/16/21 1157

## 2021-05-05 ENCOUNTER — Other Ambulatory Visit: Payer: Self-pay | Admitting: Advanced Practice Midwife

## 2021-05-05 ENCOUNTER — Telehealth: Payer: Self-pay | Admitting: Advanced Practice Midwife

## 2021-05-05 MED ORDER — MEGESTROL ACETATE 40 MG PO TABS: ORAL_TABLET | ORAL | 3 refills | Status: DC

## 2021-05-05 MED ORDER — NAPROXEN 500 MG PO TABS: 500.0000 mg | ORAL_TABLET | Freq: Two times a day (BID) | ORAL | 1 refills | Status: DC

## 2021-05-05 NOTE — Progress Notes (Signed)
On depo, started bleeding heavy Sat. Mom had rx for megace, took 3 last night. Slowed some, but not enough. Will continue Megace for (probably BTB on depo), rx naproxen. Appt for later in week in case not working

## 2021-05-05 NOTE — Telephone Encounter (Signed)
Pt's mom called, states she has several questions for Victorino Dike, explained that Victorino Dike is out of the office this afternoon so she asked that a message be sent to Drenda Freeze  No details were given  We received a fax from the after hours nurse line that they called during lunch, concerned with pt is on depo & started bleeding 2 days ago & never gets her cycle - very concerned  Please advise & call pt's mom (we do have a DPR on file)

## 2021-05-05 NOTE — Telephone Encounter (Signed)
Mother states her daughter Sandra [name] has been bleeding since Saturday and passing clots. She is having cramping and taking Ibuprofen but it is not helping.  She normally does not have bleeding while she is on the depo and is very concerned that she is bleeding. Patient has needed to leave work due to the bleeding and cramping.  Requested to speak to Victorino Dike but advised she was out of the office.  Requested then to speak with Drenda Freeze.

## 2021-05-07 ENCOUNTER — Ambulatory Visit (INDEPENDENT_AMBULATORY_CARE_PROVIDER_SITE_OTHER): Payer: Medicaid Other | Admitting: Pediatrics

## 2021-05-07 NOTE — Progress Notes (Deleted)
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 female presenting for follow-up of the above concerns.  She attended this visit ***alone.  1. [name] in 09/2017 for concerns of hypoglycemia with a syncopal episode.  Sandra [name] had been evaluated in [facility] emergency room on 08/14/2017 after fainting at a family reunion after a prolonged fast (episode occurred in the afternoon and she had not eaten since the night before). Mom attempted to feed Sandra [name] before she passed out, though was unsuccessful. She was taken to the emergency room by private vehicle and came to after smelling ammonia. CBG in the emergency room was 74, glucose as part of CMP was 94. UA was negative for glucose with 5 of ketones. The syncopal event was attributed to hypoglycemia due to poor by mouth intake prior to the family reunion. [name] was seen by her PCP on 08/26/2017 where weight was documented as 106 pounds, height documented at 5 feet 3.2 inches and A1c recorded as 5.7%.  At her initial visit to [facility] ([facility]), she was given a glucometer and diet changes were recommended including eating protein with each meal and not skipping meals.    2.Since last visit on 05/29/20, Sandra [name] has been well.     Concerning episodes: -***  Diet review:  -*** Weight: increased ***lb from last visit.  Plotting at ***% (was 25th% at last visit).  Activity:  -***  Energy: ***   ROS:  All systems reviewed with pertinent positives listed below; otherwise negative. CV: history of SVT s/p accessory tract ablation in 2015 (Dr. Loa Socks at [facility]). No ***recent cardiac issues  Past Medical History:  Past Medical History:  Diagnosis Date  . GC (gonococcus infection) 06/10/2020  . History of Holter  monitoring 10/2016   normal  . Scoliosis   . SVT (supraventricular tachycardia) (HCC)     Meds: Outpatient Encounter Medications as of 05/07/2021  Medication Sig  . ibuprofen (ADVIL) 800 MG tablet Take 1 tablet (800 mg total) by mouth every 8 (eight) hours as needed for moderate pain.  . medroxyPROGESTERone (DEPO-PROVERA) 150 MG/ML injection Inject 150 mg into the muscle every 3 (three) months.  . megestrol (MEGACE) 40 MG tablet Take 3/day (at the same time) for 5 days; 2/day for 5 days, then 1/day PO prn bleeding  . metroNIDAZOLE (FLAGYL) 500 MG tablet Take 1 tablet (500 mg total) by mouth 2 (two) times daily. (Patient not taking: Reported on 04/16/2021)  . naproxen (NAPROSYN) 500 MG tablet Take 1 tablet (500 mg total) by mouth 2 (two) times daily with a meal.   No facility-administered encounter medications on file as of 05/07/2021.    Allergies: No Known Allergies  Surgical History: Past Surgical History:  Procedure Laterality Date  . 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 RADIOFREQUENCY ABLATION      Family History:  Family History  Problem Relation Age of Onset  . Cancer Mother        Thyroid cancer s/p thyroidectomy, Dx 2015  . Irritable bowel syndrome Mother   . Thyroid disease Mother        Thyroid cancer s/p thyroidectomy, treated with levothyroxine  . Sickle cell trait Father   . Schizophrenia Maternal Grandmother   . Schizophrenia Maternal Grandfather   Mother and  older sister have presumed hypoglycemic episodes (mother describes hers as reactive hypoglycemia)  Maternal great-grandmother had diabetes treated with insulin  Social History: Lives with: Mother and younger brother  Attending [employer] ***  Physical Exam:  There were no vitals filed for this visit. There were no vitals taken for this visit. Body mass index: body mass index is unknown because there is no height or weight on file. Blood pressure percentiles  are not available for patients who are 18 years or older.  Wt Readings from Last 3 Encounters:  04/16/21 125 lb (56.7 kg) (46 %, Z= -0.11)*  06/05/20 116 lb (52.6 kg) (31 %, Z= -0.50)*  05/29/20 114 lb (51.7 kg) (27 %, Z= -0.63)*   * Growth percentiles are based on CDC (Girls, 2-20 Years) data.   Ht Readings from Last 3 Encounters:  04/16/21 5\' 4"  (1.626 m) (46 %, Z= -0.11)*  06/05/20 5\' 5"  (1.651 m) (62 %, Z= 0.29)*  05/29/20 5' 4.72" (1.644 m) (57 %, Z= 0.19)*   * Growth percentiles are based on CDC (Girls, 2-20 Years) data.   There is no height or weight on file to calculate BMI.  No weight on file for this encounter. No height on file for this encounter.  General: Well developed, well nourished ***female in no acute distress.  Appears *** stated age Head: Normocephalic, atraumatic.   Eyes:  Pupils equal and round. EOMI.   Sclera white.  No eye drainage.   Ears/Nose/Mouth/Throat: Masked Neck: supple, no cervical lymphadenopathy, no thyromegaly Cardiovascular: regular rate, normal S1/S2, no murmurs Respiratory: No increased work of breathing.  Lungs clear to auscultation bilaterally.  No wheezes. Abdomen: soft, nontender, nondistended.  Extremities: warm, well perfused, cap refill < 2 sec.   Musculoskeletal: Normal muscle mass.  Normal strength Skin: warm, dry.  No rash or lesions. Neurologic: alert and oriented, normal speech, no tremor   Laboratory Evaluation:   Ref. Range 11/21/2019 10:32  TSH Latest Units: mIU/L 1.75  T4,Free(Direct) Latest Ref Range: 0.8 - 1.4 ng/dL 1.3  Thyroxine (T4) Latest Ref Range: 5.3 - 11.7 mcg/dL 05/31/20 (H)     Ref. Range 05/29/2020 14:59  Hemoglobin A1C Latest Ref Range: 4.0 - 5.6 % 5.1  Glucose Fasting, POC Latest Ref Range: 70 - 99 mg/dL 98   Results for orders placed or performed during the hospital encounter of 04/16/21  CBC with Differential/Platelet  Result Value Ref Range   WBC 6.1 4.0 - 10.5 K/uL   RBC 4.86 3.87 - 5.11 MIL/uL    Hemoglobin 14.0 12.0 - 15.0 g/dL   HCT 05/31/2020 06/16/21 - 78.9 %   MCV 87.7 80.0 - 100.0 fL   MCH 28.8 26.0 - 34.0 pg   MCHC 32.9 30.0 - 36.0 g/dL   RDW 38.1 01.7 - 51.0 %   Platelets 273 150 - 400 K/uL   nRBC 0.0 0.0 - 0.2 %   Neutrophils Relative % 70 %   Neutro Abs 4.3 1.7 - 7.7 K/uL   Lymphocytes Relative 19 %   Lymphs Abs 1.2 0.7 - 4.0 K/uL   Monocytes Relative 9 %   Monocytes Absolute 0.6 0.1 - 1.0 K/uL   Eosinophils Relative 1 %   Eosinophils Absolute 0.1 0.0 - 0.5 K/uL   Basophils Relative 1 %   Basophils Absolute 0.0 0.0 - 0.1 K/uL   Immature Granulocytes 0 %   Abs Immature Granulocytes 0.02 0.00 - 0.07 K/uL  Comprehensive metabolic panel  Result Value Ref Range   Sodium 137  135 - 145 mmol/L   Potassium 4.3 3.5 - 5.1 mmol/L   Chloride 106 98 - 111 mmol/L   CO2 22 22 - 32 mmol/L   Glucose, Bld 94 70 - 99 mg/dL   BUN 12 6 - 20 mg/dL   Creatinine, Ser 0.98 0.44 - 1.00 mg/dL   Calcium 9.5 8.9 - 11.9 mg/dL   Total Protein 8.8 (H) 6.5 - 8.1 g/dL   Albumin 4.7 3.5 - 5.0 g/dL   AST 26 15 - 41 U/L   ALT 26 0 - 44 U/L   Alkaline Phosphatase 55 38 - 126 U/L   Total Bilirubin 0.6 0.3 - 1.2 mg/dL   GFR, Estimated >14 >78 mL/min   Anion gap 9 5 - 15  D-dimer, quantitative  Result Value Ref Range   D-Dimer, Quant 1.49 (H) 0.00 - 0.50 ug/mL-FEU  I-Stat Beta hCG blood, ED ([facility], [facility], [facility] only)  Result Value Ref Range   I-stat hCG, quantitative <5.0 <5 mIU/mL   Comment 3             Assessment/Plan:  ROSALIND GUIDO is a 20 y.o. female with past episode of syncope felt related to ketotic hypoglycemia/prolonged fasting with family history of reactive hypoglycemia. She has been doing b***  1. Ketotic hypoglycemia/ 2. Family history of hypoglycemia -Encouraged to eat small meals with protein/carbs throughout the day. -Check BG prn -Growth chart reviewed with family -Explained that A1c/fasting glucose today is normal.   3. Family history of thyroid disease/ -TFTs normal at last  visit.  No sx today.   4. Vaginal discharge -Advised to go to PCP for evaluation  Follow-up:   No follow-ups on file.  Call to be seen if episodes return   ***  Casimiro Needle, MD

## 2021-05-15 ENCOUNTER — Encounter: Payer: Self-pay | Admitting: Adult Health

## 2021-05-15 ENCOUNTER — Other Ambulatory Visit: Payer: Self-pay

## 2021-05-15 ENCOUNTER — Ambulatory Visit (INDEPENDENT_AMBULATORY_CARE_PROVIDER_SITE_OTHER): Payer: Medicaid Other | Admitting: Adult Health

## 2021-05-15 VITALS — BP 115/68 | HR 84 | Ht 64.0 in | Wt 123.2 lb

## 2021-05-15 DIAGNOSIS — N921 Excessive and frequent menstruation with irregular cycle: Secondary | ICD-10-CM | POA: Insufficient documentation

## 2021-05-15 MED ORDER — MEGESTROL ACETATE 20 MG PO TABS: ORAL_TABLET | ORAL | 1 refills | Status: DC

## 2021-05-15 NOTE — Progress Notes (Signed)
  Subjective:     Patient ID: Sandra [name], female   DOB: 23-Jul-2001, 20 y.o.   MRN: [id number]  HPI Sandra [name] is a 20 year old black female, single, G0P0, in for follow up on having bleeding with depo and took mom's megace and it stopped, would like rx for megace to have on hand, if bleeding returns. PCP is Terie Purser PA  Review of Systems BTB with depo Reviewed past medical,surgical, social and family history. Reviewed medications and allergies.     Objective:   Physical Exam BP 115/68 (BP Location: Right Arm, Patient Position: Sitting, Cuff Size: Normal)   Pulse 84   Ht 5\' 4"  (1.626 m)   Wt 123 lb 3.2 oz (55.9 kg)   LMP 05/03/2021 (Exact Date)   BMI 21.15 kg/m  Skin warm and dry. Lungs: clear to ausculation bilaterally. Cardiovascular: regular rate and rhythm.     Upstream - 05/15/21 1526      Pregnancy Intention Screening   Does the patient want to become pregnant in the next year? No    Does the patient's partner want to become pregnant in the next year? No    Would the patient like to discuss contraceptive options today? No      Contraception Wrap Up   Current Method Hormonal Injection    End Method Hormonal Injection    Contraception Counseling Provided No          Assessment:     1. Irregular intermenstrual bleeding Will rx megace  Meds ordered this encounter  Medications  . megestrol (MEGACE) 20 MG tablet    Sig: Take 4 bid if bleeding    Dispense:  40 tablet    Refill:  1    Order Specific Question:   Supervising Provider    Answer:   07/15/21 [[id number]]      Plan:     Has depo appt in July

## 2021-06-25 ENCOUNTER — Ambulatory Visit: Payer: Medicaid Other

## 2021-07-30 NOTE — Progress Notes (Signed)
CARDIOLOGY CONSULT NOTE       Patient ID: Sandra [name] MRN: [id number] DOB/AGE: 02/07/2001 19 y.o.  Admit date: (Not on file) Referring Physician: Jean Rosenthal Primary Physician: Avis Epley, PA-C Primary Cardiologist: New Reason for Consultation: SVT/Syncope  Active Problems:   * No active hospital problems. *   HPI:  20 y.o. referred by Terie Purser for SVT and syncope.  08/14/17 patient had "syncope" after prolonged fast at family reunion Came to after smelling ammonia. CBG in ER was 74. ? Thought to be from ketotic hypoglycemia. 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 involved slow pathway modification She had a normal ECG post procedure and no evidence of pre excitation TTE post ablation normal no structural heart disease and EF 80% 09/12/15 had f/u holter that was normal with no recurrence of atrial tachycardia or AV block Primary symptom had been palpitations and not syncope prior to ablation   Primary note from 07/21/21 indicates 3 brief episodes of dizziness and weakness in heat quickly resolved when sitting Not associated with palpitations, chest pain or dyspnea TSH and A1c normal   She clearly indicates her episodes do not involve palpitations and she starts to see dark spots With heat like a vagal prodrome   She smokes marijuana but no other drugs or ETOH. No bulemia/anorexia   ROS All other systems reviewed and negative except as noted above  Past Medical History:  Diagnosis Date   GC (gonococcus infection) 06/10/2020   History of Holter monitoring 10/2016   normal   Scoliosis    SVT (supraventricular tachycardia) (HCC)     Family History  Problem Relation Age of Onset   Cancer Mother        Thyroid cancer s/p thyroidectomy, Dx 2015   Irritable bowel syndrome Mother    Thyroid disease Mother        Thyroid cancer s/p thyroidectomy, treated with levothyroxine   Sickle cell  trait Father    Schizophrenia Maternal Grandmother    Schizophrenia Maternal Grandfather     Social History   Socioeconomic History   Marital status: Single    Spouse name: Not on file   Number of children: Not on file   Years of education: Not on file   Highest education level: Not on file  Occupational History   Not on file  Tobacco Use   Smoking status: Never   Smokeless tobacco: Never  Vaping Use   Vaping Use: Never used  Substance and Sexual Activity   Alcohol use: No   Drug use: Yes    Types: Marijuana   Sexual activity: Not Currently    Birth control/protection: Injection  Other Topics Concern   Not on file  Social History Narrative   Lives at home with mom and brother attends [employer] is in the 10th grade.    Social Determinants of Health   Financial Resource Strain: Not on file  Food Insecurity: Not on file  Transportation Needs: Not on file  Physical Activity: Not on file  Stress: Not on file  Social Connections: Not on file  Intimate Partner Violence: Not on file    Past Surgical History:  Procedure Laterality Date   CARDIAC SURGERY     pt reports "i had my extra fiber of my heart removed."   RADIOFREQUENCY ABLATION        Current Outpatient Medications:    cetirizine (ZYRTEC) 10 MG tablet, Take  10 mg by mouth as needed for allergies., Disp: , Rfl:    ibuprofen (ADVIL) 800 MG tablet, Take 1 tablet (800 mg total) by mouth every 8 (eight) hours as needed for moderate pain., Disp: 21 tablet, Rfl: 0   medroxyPROGESTERone (DEPO-PROVERA) 150 MG/ML injection, Inject 150 mg into the muscle every 3 (three) months., Disp: , Rfl:    megestrol (MEGACE) 20 MG tablet, Take 20 mg by mouth as needed. If bleeding, Disp: , Rfl:    naproxen (NAPROSYN) 500 MG tablet, Take 500 mg by mouth 2 (two) times daily as needed for moderate pain (or if bleeding)., Disp: , Rfl:     Physical Exam: Blood pressure (!) 100/50, pulse 66, height 5\' 4"  (1.626 m), weight 53.4 kg,  SpO2 93 %.    Affect appropriate Healthy:  appears stated age HEENT: normal Neck supple with no adenopathy JVP normal no bruits no thyromegaly Lungs clear with no wheezing and good diaphragmatic motion Heart:  S1/S2 no murmur, no rub, gallop or click PMI normal Abdomen: benighn, BS positve, no tenderness, no AAA no bruit.  No HSM or HJR Distal pulses intact with no bruits No edema Neuro non-focal Skin warm and dry Scoliosis  Postural signs negative 100 mmhg sitting and standing for 3 minutes Pulse steady in 70's no POTS    Labs:   Lab Results  Component Value Date   WBC 6.1 04/16/2021   HGB 14.0 04/16/2021   HCT 42.6 04/16/2021   MCV 87.7 04/16/2021   PLT 273 04/16/2021   No results for input(s): NA, K, CL, CO2, BUN, CREATININE, CALCIUM, PROT, BILITOT, ALKPHOS, ALT, AST, GLUCOSE in the last 168 hours.  Invalid input(s): LABALBU No results found for: CKTOTAL, CKMB, CKMBINDEX, TROPONINI No results found for: CHOL No results found for: HDL No results found for: LDLCALC No results found for: TRIG No results found for: CHOLHDL No results found for: LDLDIRECT    Radiology: No results found.  EKG: sR rate 78 ICRBBB LAE 09/04/20    ASSESSMENT AND PLAN:   SVT:  post ablation with slow pathway modification 2016.  No apparent recurrence ECG today ICRBBB SR no pathway stable Since episodes not related to palpitations and doubt bradycardia will defer monitor  Syncope:  previous w/u thought to be from reactive hypoglycemia but sound like simple vagal episodes in thin/low weight female with baseline low BP.  Encouraged salt intake and some weight gain will check echo and refer to EP Doubt that tilt table testing or ILR would be indicated  Birth Control:  Depo Provera injections q 3 months   Echo F/U EP   Signed: 2017 08/01/2021, 2:41 PM

## 2021-08-01 ENCOUNTER — Other Ambulatory Visit: Payer: Self-pay

## 2021-08-01 ENCOUNTER — Ambulatory Visit (INDEPENDENT_AMBULATORY_CARE_PROVIDER_SITE_OTHER): Payer: Medicaid Other | Admitting: Cardiovascular Disease

## 2021-08-01 ENCOUNTER — Encounter: Payer: Self-pay | Admitting: Cardiovascular Disease

## 2021-08-01 ENCOUNTER — Encounter: Payer: Self-pay | Admitting: *Deleted

## 2021-08-01 VITALS — BP 100/50 | HR 66 | Ht 64.0 in | Wt 117.8 lb

## 2021-08-01 DIAGNOSIS — R55 Syncope and collapse: Secondary | ICD-10-CM | POA: Diagnosis not present

## 2021-08-01 NOTE — Patient Instructions (Signed)
Medication Instructions:  Your physician recommends that you continue on your current medications as directed. Please refer to the Current Medication list given to you today.  *If you need a refill on your cardiac medications before your next appointment, please call your pharmacy*   Lab Work: NONE   If you have labs (blood work) drawn today and your tests are completely normal, you will receive your results only by: MyChart Message (if you have MyChart) OR A paper copy in the mail If you have any lab test that is abnormal or we need to change your treatment, we will call you to review the results.   Testing/Procedures: Your physician has requested that you have an echocardiogram. Echocardiography is a painless test that uses sound waves to create images of your heart. It provides your doctor with information about the size and shape of your heart and how well your heart's chambers and valves are working. This procedure takes approximately one hour. There are no restrictions for this procedure.    Follow-Up: At [facility], you and your health needs are our priority.  As part of our continuing mission to provide you with exceptional heart care, we have created designated Provider Care Teams.  These Care Teams include your primary Cardiologist (physician) and Advanced Practice Providers (APPs -  Physician Assistants and Nurse Practitioners) who all work together to provide you with the care you need, when you need it.  We recommend signing up for the patient portal called "MyChart".  Sign up information is provided on this After Visit Summary.  MyChart is used to connect with patients for Virtual Visits (Telemedicine).  Patients are able to view lab/test results, encounter notes, upcoming appointments, etc.  Non-urgent messages can be sent to your provider as well.   To learn more about what you can do with MyChart, go to [email or web address].    Your next appointment:    Next  available   The format for your next appointment:   In Person  Provider:   Lewayne Bunting, MD   Other Instructions Thank you for choosing [facility]!

## 2021-08-20 ENCOUNTER — Ambulatory Visit: Payer: Medicaid Other | Admitting: Internal Medicine

## 2021-08-26 ENCOUNTER — Other Ambulatory Visit: Payer: Self-pay

## 2021-08-26 ENCOUNTER — Ambulatory Visit (HOSPITAL_COMMUNITY)
Admission: RE | Admit: 2021-08-26 | Discharge: 2021-08-26 | Disposition: A | Discharge status: Home / Self Care | Payer: Medicaid Other | Source: Ambulatory Visit | Attending: Cardiovascular Disease | Admitting: Cardiovascular Disease

## 2021-08-26 DIAGNOSIS — R55 Syncope and collapse: Secondary | ICD-10-CM | POA: Diagnosis present

## 2021-08-26 LAB — ECHOCARDIOGRAM COMPLETE
Area-P 1/2: 3.53 cm2
S' Lateral: 2.58 cm

## 2021-08-26 NOTE — Progress Notes (Signed)
*  PRELIMINARY RESULTS* Echocardiogram 2D Echocardiogram has been performed.  Stacey Drain 08/26/2021, 2:57 PM

## 2021-09-02 ENCOUNTER — Ambulatory Visit (INDEPENDENT_AMBULATORY_CARE_PROVIDER_SITE_OTHER): Payer: Medicaid Other | Admitting: Internal Medicine

## 2021-09-02 ENCOUNTER — Other Ambulatory Visit: Payer: Self-pay

## 2021-09-02 ENCOUNTER — Encounter: Payer: Self-pay | Admitting: Internal Medicine

## 2021-09-02 VITALS — BP 105/65 | HR 83 | Ht 64.0 in | Wt 119.0 lb

## 2021-09-02 DIAGNOSIS — R55 Syncope and collapse: Secondary | ICD-10-CM

## 2021-09-02 NOTE — Progress Notes (Signed)
HPI Sandra [name] is referred today by Dr. Eden Emms for evaluation of syncope. She is a pleasant 20 yo woman with a h/o SVT who underwent EP study and catheter ablation at [facility] 6 years ago. She had a successful slow pathway modification. Before her ablation she notes that she could feel her heart race and at times she would pass out. She did well since then until May when she had recurrent episodes of syncope. This occurred while at work. She felt suddenly warm and became dizzy and lightheaded and passed out. She had several episodes that day but has not had any since. Dr. Eden Emms documented that she had missed a meal but she denies that to me. She has not had symptoms of SVT.  No Known Allergies   Current Outpatient Medications  Medication Sig Dispense Refill   cetirizine (ZYRTEC) 10 MG tablet Take 10 mg by mouth as needed for allergies.     ibuprofen (ADVIL) 800 MG tablet Take 1 tablet (800 mg total) by mouth every 8 (eight) hours as needed for moderate pain. 21 tablet 0   medroxyPROGESTERone (DEPO-PROVERA) 150 MG/ML injection Inject 150 mg into the muscle every 3 (three) months.     megestrol (MEGACE) 20 MG tablet Take 20 mg by mouth as needed. If bleeding     naproxen (NAPROSYN) 500 MG tablet Take 500 mg by mouth 2 (two) times daily as needed for moderate pain (or if bleeding).     No current facility-administered medications for this visit.     Past Medical History:  Diagnosis Date   GC (gonococcus infection) 06/10/2020   History of Holter monitoring 10/2016   normal   Scoliosis    SVT (supraventricular tachycardia) (HCC)     ROS:   All systems reviewed and negative except as noted in the HPI.   Past Surgical History:  Procedure Laterality Date   CARDIAC SURGERY     pt reports "i had my extra fiber of my heart removed."   RADIOFREQUENCY ABLATION       Family History  Problem Relation Age of Onset   Cancer Mother        Thyroid cancer s/p thyroidectomy, Dx 2015    Irritable bowel syndrome Mother    Thyroid disease Mother        Thyroid cancer s/p thyroidectomy, treated with levothyroxine   Sickle cell trait Father    Schizophrenia Maternal Grandmother    Schizophrenia Maternal Grandfather      Social History   Socioeconomic History   Marital status: Single    Spouse name: Not on file   Number of children: Not on file   Years of education: Not on file   Highest education level: Not on file  Occupational History   Not on file  Tobacco Use   Smoking status: Never   Smokeless tobacco: Never  Vaping Use   Vaping Use: Never used  Substance and Sexual Activity   Alcohol use: No   Drug use: Yes    Types: Marijuana   Sexual activity: Not Currently    Birth control/protection: Injection  Other Topics Concern   Not on file  Social History Narrative   Lives at home with mom and brother attends [employer] is in the 10th grade.    Social Determinants of Health   Financial Resource Strain: Not on file  Food Insecurity: Not on file  Transportation Needs: Not on file  Physical Activity: Not on file  Stress:  Not on file  Social Connections: Not on file  Intimate Partner Violence: Not on file     BP 105/65   Pulse 83   Ht 5\' 4"  (1.626 m)   Wt 119 lb (54 kg)   BMI 20.43 kg/m   Physical Exam:  Well appearing NAD HEENT: Unremarkable Neck:  No JVD, no thyromegally Lymphatics:  No adenopathy Back:  No CVA tenderness Lungs:  Clear with no wheezes HEART:  Regular rate rhythm, no murmurs, no rubs, no clicks Abd:  soft, positive bowel sounds, no organomegally, no rebound, no guarding Ext:  2 plus pulses, no edema, no cyanosis, no clubbing Skin:  No rashes no nodules Neuro:  CN II through XII intact, motor grossly intact  EKG - none   Assess/Plan:  Syncope - she has not had another episode since May.She has fairly classic autonomic dysfunction. I discussed the treatment options and recommended she maintain hydration and  adequate sodium intake, avoid caffeine, ETOH, and THC. She is encouraged to lie down when she feels a spell. I will see back as needed. SVT - she is s/p ablation and has not had any symptoms of recurrent SVT.  June Lakesha Levinson,MD

## 2021-09-02 NOTE — Patient Instructions (Signed)
Medication Instructions:  Your physician recommends that you continue on your current medications as directed. Please refer to the Current Medication list given to you today.  *If you need a refill on your cardiac medications before your next appointment, please call your pharmacy*   Lab Work: NONE   If you have labs (blood work) drawn today and your tests are completely normal, you will receive your results only by: MyChart Message (if you have MyChart) OR A paper copy in the mail If you have any lab test that is abnormal or we need to change your treatment, we will call you to review the results.   Testing/Procedures: NONE    Follow-Up: At [facility], you and your health needs are our priority.  As part of our continuing mission to provide you with exceptional heart care, we have created designated Provider Care Teams.  These Care Teams include your primary Cardiologist (physician) and Advanced Practice Providers (APPs -  Physician Assistants and Nurse Practitioners) who all work together to provide you with the care you need, when you need it.  We recommend signing up for the patient portal called "MyChart".  Sign up information is provided on this After Visit Summary.  MyChart is used to connect with patients for Virtual Visits (Telemedicine).  Patients are able to view lab/test results, encounter notes, upcoming appointments, etc.  Non-urgent messages can be sent to your provider as well.   To learn more about what you can do with MyChart, go to [email or web address].    Your next appointment:    AS Needed   The format for your next appointment:   In Person  Provider:   Lewayne Bunting, MD   Other Instructions Thank you for choosing [facility]!

## 2021-12-30 ENCOUNTER — Ambulatory Visit: Payer: Medicaid Other

## 2021-12-31 ENCOUNTER — Ambulatory Visit (INDEPENDENT_AMBULATORY_CARE_PROVIDER_SITE_OTHER): Payer: Medicaid Other | Admitting: *Deleted

## 2021-12-31 ENCOUNTER — Other Ambulatory Visit: Payer: Self-pay

## 2021-12-31 ENCOUNTER — Other Ambulatory Visit (HOSPITAL_COMMUNITY)
Admission: RE | Admit: 2021-12-31 | Discharge: 2021-12-31 | Disposition: A | Discharge status: Home / Self Care | Payer: Medicaid Other | Source: Ambulatory Visit | Attending: Obstetrics & Gynecology | Admitting: Obstetrics & Gynecology

## 2021-12-31 DIAGNOSIS — Z113 Encounter for screening for infections with a predominantly sexual mode of transmission: Secondary | ICD-10-CM

## 2021-12-31 DIAGNOSIS — Z3042 Encounter for surveillance of injectable contraceptive: Secondary | ICD-10-CM

## 2021-12-31 MED ORDER — MEDROXYPROGESTERONE ACETATE 150 MG/ML IM SUSP
150.0000 mg | Freq: Once | INTRAMUSCULAR | Status: AC
  Administered 2021-12-31: 150 mg via INTRAMUSCULAR

## 2021-12-31 NOTE — Progress Notes (Addendum)
° °  NURSE VISIT- VAGINITIS/STD/POC  SUBJECTIVE:  Sandra [name] is a 21 y.o. G0P0000 GYN patientfemale here for a vaginal swab for vaginitis screening, STD screen.  She reports the following symptoms: none for 0 days. Denies abnormal vaginal bleeding, significant pelvic pain, fever, or UTI symptoms. Pt received Depo in left hip.   OBJECTIVE:  LMP 11/24/2021 (Approximate)   Appears well, in no apparent distress  ASSESSMENT: Vaginal swab for vaginitis screening & STD screening. Depo given also.  PLAN: Self-collected vaginal probe for Gonorrhea, Chlamydia, Trichomonas, Bacterial Vaginosis, Yeast sent to lab Treatment: to be determined once results are received Follow-up as needed if symptoms persist/worsen, or new symptoms develop. Return in 12 weeks for next Depo.   Malachy Mood  12/31/2021 10:29 AM  Chart reviewed for nurse visit. Agree with plan of care.  Cheral Marker, [place] 12/31/2021 10:38 AM

## 2022-01-01 ENCOUNTER — Other Ambulatory Visit: Payer: Self-pay | Admitting: Women's Health

## 2022-01-01 LAB — CERVICOVAGINAL ANCILLARY ONLY
Bacterial Vaginitis (gardnerella): POSITIVE — AB
Candida Glabrata: NEGATIVE
Candida Vaginitis: POSITIVE — AB
Chlamydia: NEGATIVE
Comment: NEGATIVE
Comment: NEGATIVE
Comment: NEGATIVE
Comment: NEGATIVE
Comment: NEGATIVE
Comment: NORMAL
Neisseria Gonorrhea: NEGATIVE
Trichomonas: NEGATIVE

## 2022-01-01 MED ORDER — FLUCONAZOLE 150 MG PO TABS: 150.0000 mg | ORAL_TABLET | Freq: Once | ORAL | 0 refills | Status: AC

## 2022-01-01 MED ORDER — METRONIDAZOLE 500 MG PO TABS: 500.0000 mg | ORAL_TABLET | Freq: Two times a day (BID) | ORAL | 0 refills | Status: DC

## 2022-02-11 ENCOUNTER — Other Ambulatory Visit: Payer: Self-pay

## 2022-02-11 ENCOUNTER — Emergency Department (HOSPITAL_COMMUNITY)
Admission: EM | Admit: 2022-02-11 | Discharge: 2022-02-11 | Disposition: A | Discharge status: Home / Self Care | Payer: Medicaid Other | Attending: Emergency Medicine | Admitting: Emergency Medicine

## 2022-02-11 DIAGNOSIS — N390 Urinary tract infection, site not specified: Secondary | ICD-10-CM | POA: Diagnosis not present

## 2022-02-11 DIAGNOSIS — Z711 Person with feared health complaint in whom no diagnosis is made: Secondary | ICD-10-CM

## 2022-02-11 DIAGNOSIS — Z202 Contact with and (suspected) exposure to infections with a predominantly sexual mode of transmission: Secondary | ICD-10-CM | POA: Diagnosis present

## 2022-02-11 LAB — CBC WITH DIFFERENTIAL/PLATELET
Abs Immature Granulocytes: 0.01 10*3/uL (ref 0.00–0.07)
Basophils Absolute: 0 10*3/uL (ref 0.0–0.1)
Basophils Relative: 1 %
Eosinophils Absolute: 0.1 10*3/uL (ref 0.0–0.5)
Eosinophils Relative: 1 %
HCT: 38.7 % (ref 36.0–46.0)
Hemoglobin: 13 g/dL (ref 12.0–15.0)
Immature Granulocytes: 0 %
Lymphocytes Relative: 41 %
Lymphs Abs: 2.5 10*3/uL (ref 0.7–4.0)
MCH: 29.5 pg (ref 26.0–34.0)
MCHC: 33.6 g/dL (ref 30.0–36.0)
MCV: 87.8 fL (ref 80.0–100.0)
Monocytes Absolute: 0.6 10*3/uL (ref 0.1–1.0)
Monocytes Relative: 10 %
Neutro Abs: 2.9 10*3/uL (ref 1.7–7.7)
Neutrophils Relative %: 47 %
Platelets: 194 10*3/uL (ref 150–400)
RBC: 4.41 MIL/uL (ref 3.87–5.11)
RDW: 12.6 % (ref 11.5–15.5)
WBC: 6.1 10*3/uL (ref 4.0–10.5)
nRBC: 0 % (ref 0.0–0.2)

## 2022-02-11 LAB — URINALYSIS, ROUTINE W REFLEX MICROSCOPIC
Bilirubin Urine: NEGATIVE
Glucose, UA: NEGATIVE mg/dL
Ketones, ur: 80 mg/dL — AB
Nitrite: NEGATIVE
Protein, ur: NEGATIVE mg/dL
Specific Gravity, Urine: 1.018 (ref 1.005–1.030)
pH: 6 (ref 5.0–8.0)

## 2022-02-11 LAB — BASIC METABOLIC PANEL
Anion gap: 11 (ref 5–15)
BUN: 12 mg/dL (ref 6–20)
CO2: 21 mmol/L — ABNORMAL LOW (ref 22–32)
Calcium: 9.5 mg/dL (ref 8.9–10.3)
Chloride: 105 mmol/L (ref 98–111)
Creatinine, Ser: 0.73 mg/dL (ref 0.44–1.00)
GFR, Estimated: >60 mL/min (ref >60)
Glucose, Bld: 73 mg/dL (ref 70–99)
Potassium: 3.4 mmol/L — ABNORMAL LOW (ref 3.5–5.1)
Sodium: 137 mmol/L (ref 135–145)

## 2022-02-11 LAB — WET PREP, GENITAL
Clue Cells Wet Prep HPF POC: NONE SEEN
Sperm: NONE SEEN
Trich, Wet Prep: NONE SEEN
WBC, Wet Prep HPF POC: <10 (ref <10)
Yeast Wet Prep HPF POC: NONE SEEN

## 2022-02-11 LAB — PREGNANCY, URINE: Preg Test, Ur: NEGATIVE

## 2022-02-11 MED ORDER — CEPHALEXIN 500 MG PO CAPS: 500.0000 mg | ORAL_CAPSULE | Freq: Two times a day (BID) | ORAL | 0 refills | Status: AC

## 2022-02-11 NOTE — ED Provider Notes (Signed)
[facility] EMERGENCY DEPARTMENT Provider Note   CSN: [id number] Arrival date & time: 02/11/22  1638     History  Chief Complaint  Patient presents with   Exposure to STD    Sandra [name] is a 21 y.o. female presenting to the ED for STD testing and concern for UTI.  Has been having foul-smelling urine for the past few days.  She has been on Depo Provera shots for several years and will have intermittent bleeding.  However when she tells her provider about this "they do not do anything about it."  She started experiencing bleeding yesterday.  States that she will be passing some clots but otherwise does not have heavy bleeding.  She denies any significant abdominal or pelvic pain.  She is concerned about STDs but no known exposures.  Denies any back pain, fever, vomiting.   Exposure to STD Pertinent negatives include no chest pain, no abdominal pain and no shortness of breath.      Home Medications Prior to Admission medications   Medication Sig Start Date End Date Taking? Authorizing Provider  cephALEXin (KEFLEX) 500 MG capsule Take 1 capsule (500 mg total) by mouth 2 (two) times daily for 7 days. 02/11/22 02/18/22 Yes Wynter Isaacs, PA-C  cetirizine (ZYRTEC) 10 MG tablet Take 10 mg by mouth as needed for allergies. 04/24/21   [provider]  ibuprofen (ADVIL) 800 MG tablet Take 1 tablet (800 mg total) by mouth every 8 (eight) hours as needed for moderate pain. 04/16/21   Bethann Berkshire, MD  medroxyPROGESTERone (DEPO-PROVERA) 150 MG/ML injection Inject 150 mg into the muscle every 3 (three) months.    [provider]  megestrol (MEGACE) 20 MG tablet Take 20 mg by mouth as needed. If bleeding Patient not taking: Reported on 12/31/2021    [provider]  metroNIDAZOLE (FLAGYL) 500 MG tablet Take 1 tablet (500 mg total) by mouth 2 (two) times daily. 01/01/22   Cheral Marker, CNM  naproxen (NAPROSYN) 500 MG tablet Take 500 mg by mouth 2  (two) times daily as needed for moderate pain (or if bleeding). Patient not taking: Reported on 12/31/2021    [provider]      Allergies    Patient has no known allergies.    Review of Systems   Review of Systems  Constitutional:  Negative for appetite change, chills and fever.  HENT:  Negative for ear pain, rhinorrhea, sneezing and sore throat.   Eyes:  Negative for photophobia and visual disturbance.  Respiratory:  Negative for cough, chest tightness, shortness of breath and wheezing.   Cardiovascular:  Negative for chest pain and palpitations.  Gastrointestinal:  Negative for abdominal pain, blood in stool, constipation, diarrhea, nausea and vomiting.  Genitourinary:  Positive for dysuria and vaginal bleeding. Negative for hematuria and urgency.  Musculoskeletal:  Negative for myalgias.  Skin:  Negative for rash.  Neurological:  Negative for dizziness, weakness and light-headedness.   Physical Exam Updated Vital Signs BP 116/78    Pulse 75    Temp 98.4 F (36.9 C) (Oral)    Resp 14    LMP 01/30/2022 (Exact Date)    SpO2 100%  Physical Exam Vitals and nursing note reviewed. Exam conducted with a chaperone present.  Constitutional:      General: She is not in acute distress.    Appearance: She is well-developed.  HENT:     Head: Normocephalic and atraumatic.     Nose: Nose normal.  Eyes:     General: No scleral icterus.       Left eye: No discharge.     Conjunctiva/sclera: Conjunctivae normal.  Cardiovascular:     Rate and Rhythm: Normal rate and regular rhythm.     Heart sounds: Normal heart sounds. No murmur heard.   No friction rub. No gallop.  Pulmonary:     Effort: Pulmonary effort is normal. No respiratory distress.     Breath sounds: Normal breath sounds.  Abdominal:     General: Bowel sounds are normal. There is no distension.     Palpations: Abdomen is soft.     Tenderness: There is no abdominal tenderness. There is no guarding.  Genitourinary:     Vagina: Vaginal discharge and bleeding present.     Cervix: No cervical motion tenderness.     Uterus: Not tender.      Adnexa:        Right: No tenderness.         Left: No tenderness.       Comments: Pelvic exam: normal external genitalia without evidence of trauma. VULVA: normal appearing vulva with no masses, tenderness or lesion. VAGINA: normal appearing vagina with normal color and slight thick discharge discharge, no lesions.  Small amount of bleeding noted in vaginal vault. CERVIX: normal appearing cervix without lesions, cervical motion tenderness absent, cervical os closed with out purulent discharge; No vaginal discharge. Wet prep and DNA probe for chlamydia and GC obtained.   ADNEXA: normal adnexa in size, nontender and no masses UTERUS: uterus is normal size, shape, consistency and nontender.   Musculoskeletal:        General: Normal range of motion.     Cervical back: Normal range of motion and neck supple.  Skin:    General: Skin is warm and dry.     Findings: No rash.  Neurological:     Mental Status: She is alert.     Motor: No abnormal muscle tone.     Coordination: Coordination normal.    ED Results / Procedures / Treatments   Labs (all labs ordered are listed, but only abnormal results are displayed) Labs Reviewed  URINALYSIS, ROUTINE W REFLEX MICROSCOPIC - Abnormal; Notable for the following components:      Result Value   APPearance HAZY (*)    Hgb urine dipstick MODERATE (*)    Ketones, ur 80 (*)    Leukocytes,Ua SMALL (*)    Bacteria, UA MANY (*)    Non Squamous Epithelial 0-5 (*)    All other components within normal limits  BASIC METABOLIC PANEL - Abnormal; Notable for the following components:   Potassium 3.4 (*)    CO2 21 (*)    All other components within normal limits  WET PREP, GENITAL  CBC WITH DIFFERENTIAL/PLATELET  PREGNANCY, URINE  RAPID HIV SCREEN (HIV 1/2 AB+AG)  RPR  GC/CHLAMYDIA PROBE AMP ([place]) NOT AT [facility]     EKG None  Radiology No results found.  Procedures Procedures    Medications Ordered in ED Medications - No data to display  ED Course/ Medical Decision Making/ A&P Clinical Course as of 02/11/22 2303  Wed Feb 11, 2022  1936 Hemoglobin: 13.0 [HK]  1936 Glori Luis): SMALL [HK]  1936 Bacteria, UA(!): MANY [HK]  2241 Trich, Wet Prep: NONE SEEN [HK]  2300 Yeast Wet Prep HPF POC: NONE SEEN [HK]  2300 Preg Test, Ur: NEGATIVE [HK]    Clinical Course User Index [HK] Juliannah Ohmann, PA-C  Medical Decision Making Amount and/or Complexity of Data Reviewed Labs: ordered. Decision-making details documented in ED Course.   21 year old female presenting to the ED for concern for STD and foul-smelling urine.  Symptoms began a few days ago.  She has been on Depo-Provera shots for several years and will have intermittent bleeding from time to time.  This current episode of bleeding started yesterday.  She denies any heavy bleeding, lightheadedness, shortness of breath.  No abdominal or pelvic pain.  No back pain.  On exam abdomen is soft.  Pelvic exam reveals small amount of bleeding in the vaginal vault as well as discharge.  No cervical motion tenderness, adnexal tenderness or uterine tenderness.  Lab work including CBC, BMP unremarkable.  Urine pregnancy test is negative.  Wet prep negative.  Patient states that she will follow-up with her provider regarding her bleeding because she wants to be taken off of the Depo-Provera.  We will call her with any positive results of her STD testing.  Patient is hemodynamically stable and agreeable to discharge.  We will place her on antibiotics for her symptoms related to her UTI.   Patient is hemodynamically stable, in NAD, and able to ambulate in the ED. Evaluation does not show pathology that would require ongoing emergent intervention or inpatient treatment. I explained the diagnosis to the patient. Pain has been managed  and has no complaints prior to discharge. Patient is comfortable with above plan and is stable for discharge at this time. All questions were answered prior to disposition. Strict return precautions for returning to the ED were discussed. Encouraged follow up with PCP.   An After Visit Summary was printed and given to the patient.   Portions of this note were generated with Scientist, clinical (histocompatibility and immunogenetics). Dictation errors may occur despite best attempts at proofreading.         Final Clinical Impression(s) / ED Diagnoses Final diagnoses:  Concern about STD in female without diagnosis  Lower urinary tract infectious disease    Rx / DC Orders ED Discharge Orders          Ordered    cephALEXin (KEFLEX) 500 MG capsule  2 times daily        02/11/22 2303              Dietrich Pates, PA-C 02/11/22 2304    Charlynne Pander, MD 02/12/22 [phone]

## 2022-02-11 NOTE — ED Provider Triage Note (Signed)
Emergency Medicine Provider Triage Evaluation Note ? ?Sandra [name] , a 21 y.o. female  was evaluated in triage.  Pt complaining of foul-smelling urine, vaginal bleeding.  Patient states that she receives Depo shots, usually does not have period.  Patient reports LMP on 2/17, she has been bleeding since this time.  Patient reports that she is not going through more than 1 pad an hour.  Patient also requesting STD evaluation, denies any symptoms.  States that she just wants an STD check because "I am sexually active". ? ?Review of Systems  ?Positive: Foul-smelling urine, vaginal bleeding ?Negative: Lightheadedness, dizziness, weakness, vaginal discharge, pelvic pain, fevers ? ?Physical Exam  ?BP 116/78   Pulse 75   Temp 98.4 ?F (36.9 ?C) (Oral)   Resp 14   LMP 01/30/2022 (Exact Date)   SpO2 100%  ?Gen:   Awake, no distress   ?Resp:  Normal effort  ?MSK:   Moves extremities without difficulty  ?Other:   ? ?Medical Decision Making  ?Medically screening exam initiated at 5:08 PM.  Appropriate orders placed.  KHAMARI YOUSUF was informed that the remainder of the evaluation will be completed by another provider, this initial triage assessment does not replace that evaluation, and the importance of remaining in the ED until their evaluation is complete. ? ? ?  ?Al Decant, PA-C ?02/11/22 1709 ? ?

## 2022-02-11 NOTE — Discharge Instructions (Addendum)
We will call you with the results of your STD testing if it is positive. Your wet prep was negative. ?I will place you on an antibiotic for some bacteria and leukocytes in your urine because you are symptomatic. ?Follow-up with your primary care provider and OB/GYN. ?Return to the ER if you start to experience worsening symptoms, severe abdominal or pelvic pain, fever, increased bleeding or lightheadedness. ?

## 2022-02-11 NOTE — ED Triage Notes (Signed)
Pt presents to ED for STD check, although denies symptoms. Also reports her urine is strong smelling, but denies urinary frequency or burning with urination. LMP 2/17 but reports she is still bleeding, despite getting depo shot and not normally bleeding.  ?

## 2022-02-13 LAB — GC/CHLAMYDIA PROBE AMP (~~LOC~~) NOT AT ARMC
Chlamydia: NEGATIVE
Comment: NEGATIVE
Comment: NORMAL
Neisseria Gonorrhea: NEGATIVE

## 2022-02-17 ENCOUNTER — Ambulatory Visit (INDEPENDENT_AMBULATORY_CARE_PROVIDER_SITE_OTHER): Payer: Medicaid Other | Admitting: Adult Health

## 2022-02-17 ENCOUNTER — Encounter: Payer: Self-pay | Admitting: Adult Health

## 2022-02-17 ENCOUNTER — Other Ambulatory Visit: Payer: Self-pay

## 2022-02-17 VITALS — BP 108/75 | HR 72 | Ht 64.0 in | Wt 117.8 lb

## 2022-02-17 DIAGNOSIS — N921 Excessive and frequent menstruation with irregular cycle: Secondary | ICD-10-CM

## 2022-02-17 MED ORDER — MEGESTROL ACETATE 40 MG PO TABS: ORAL_TABLET | ORAL | 0 refills | Status: DC

## 2022-02-17 NOTE — Progress Notes (Signed)
?  Subjective:  ?  ? Patient ID: Sandra [name], female   DOB: 09-10-2001, 21 y.o.   MRN: [id number] ? ?HPI ?Sandra [name] is a 21 year old black female,single, G0P0, in complaining of bleeding on and off since 01/30/22, had last depo 12/31/21. She was seen in ER 02/11/22 and treated for UTI with keflex. HGB was 14, GC/chlamydia was negative. ?PCP is Terie Purser PA  ? ?Review of Systems ?Has been bleeding on and off since 01/30/22  ?Reviewed past medical,surgical, social and family history. Reviewed medications and allergies.  ?   ?Objective:  ? Physical Exam ?BP 108/75 (BP Location: Right Arm, Patient Position: Sitting, Cuff Size: Normal)   Pulse 72   Ht 5\' 4"  (1.626 m)   Wt 117 lb 12.8 oz (53.4 kg)   LMP 01/30/2022 (Exact Date)   BMI 20.22 kg/m?   ?  Skin warm and dry. Lungs: clear to ausculation bilaterally. Cardiovascular: regular rate and rhythm.  ?Pelvic is deferred.  ? Upstream - 02/17/22 1141   ? ?  ? Pregnancy Intention Screening  ? Does the patient want to become pregnant in the next year? No   ? Does the patient's partner want to become pregnant in the next year? No   ? Would the patient like to discuss contraceptive options today? No   ?  ? Contraception Wrap Up  ? Current Method Hormonal Injection   ? End Method Hormonal Injection   ? Contraception Counseling Provided No   ? ?  ?  ? ?  ?  ?Assessment:  ?   ?1. Irregular intermenstrual bleeding ?Had last dep 12/31/21, not sure if wants to continue ?Will rx megace to stop bleeding ?Meds ordered this encounter  ?Medications  ? megestrol (MEGACE) 40 MG tablet  ?  Sig: Take 3 x 5 days then 2 x 5 days then 1 daily till bleeding stops  ?  Dispense:  45 tablet  ?  Refill:  0  ?  Order Specific Question:   Supervising Provider  ?  Answer:   01/02/22 H [[id number]]  ?  ?   ?Plan:  ?  Follow up in 3 weeks to discuss getting depo or another birth control ?  Has depo appt 03/19/22  ?

## 2022-03-10 ENCOUNTER — Ambulatory Visit (INDEPENDENT_AMBULATORY_CARE_PROVIDER_SITE_OTHER): Payer: Medicaid Other | Admitting: Adult Health

## 2022-03-10 ENCOUNTER — Other Ambulatory Visit: Payer: Self-pay

## 2022-03-10 ENCOUNTER — Encounter: Payer: Self-pay | Admitting: Adult Health

## 2022-03-10 VITALS — BP 114/69 | HR 90 | Ht 64.0 in | Wt 118.5 lb

## 2022-03-10 DIAGNOSIS — N921 Excessive and frequent menstruation with irregular cycle: Secondary | ICD-10-CM | POA: Diagnosis not present

## 2022-03-10 NOTE — Progress Notes (Signed)
?  Subjective:  ?  ? Patient ID: Sandra [name], female   DOB: November 17, 2001, 21 y.o.   MRN: [id number] ? ?HPI ?Sandra [name] is a 21 year old black female, single, G0P0, back in follow up on bleeding and taking megace,it did not stop. ?PCP is Terie Purser PA. ? ? ?Review of Systems ?Bleeding did not stop with megace  ?But is lighter, can wear pantie liner ?  Patient denies any headaches, hearing loss, fatigue, blurred vision, shortness of breath, chest pain, abdominal pain, problems with bowel movements, urination, or intercourse. No joint pain or mood swings.  ?Reviewed past medical,surgical, social and family history. Reviewed medications and allergies.  ?Objective:  ? Physical Exam ?BP 114/69 (BP Location: Left Arm, Patient Position: Sitting, Cuff Size: Normal)   Pulse 90   Ht 5\' 4"  (1.626 m)   Wt 118 lb 8 oz (53.8 kg)   LMP 01/30/2022 Comment: ongoing bleeding  BMI 20.34 kg/m?   ?  Skin warm and dry. Lungs: clear to ausculation bilaterally. Cardiovascular: regular rate and rhythm.  ?Fall risk is low ? Upstream - 03/10/22 1352   ? ?  ? Pregnancy Intention Screening  ? Does the patient want to become pregnant in the next year? No   ? Does the patient's partner want to become pregnant in the next year? No   ? Would the patient like to discuss contraceptive options today? No   ?  ? Contraception Wrap Up  ? Current Method Hormonal Injection   ? End Method Hormonal Injection   ? ?  ?  ? ?  ?  ?Assessment:  ?   ?1. Irregular intermenstrual bleeding ?Has slowed to just wearing pantie liner ?Has stopped megace ?Next depo due 03/19/22,hopefully the bleeding will stop with next depo ?   ?Plan:  ?   ?Get depo 03/19/22  ?   ?

## 2022-03-19 ENCOUNTER — Ambulatory Visit: Payer: Medicaid Other

## 2022-03-20 ENCOUNTER — Ambulatory Visit: Payer: Medicaid Other

## 2022-04-29 ENCOUNTER — Encounter: Payer: Self-pay | Admitting: Adult Health

## 2022-04-29 ENCOUNTER — Ambulatory Visit (INDEPENDENT_AMBULATORY_CARE_PROVIDER_SITE_OTHER): Payer: Medicaid Other | Admitting: Adult Health

## 2022-04-29 VITALS — BP 104/71 | HR 71 | Ht 64.0 in | Wt 118.0 lb

## 2022-04-29 DIAGNOSIS — N921 Excessive and frequent menstruation with irregular cycle: Secondary | ICD-10-CM

## 2022-04-29 MED ORDER — ESTRADIOL 1 MG PO TABS: 1.0000 mg | ORAL_TABLET | Freq: Every day | ORAL | 0 refills | Status: DC

## 2022-04-29 NOTE — Progress Notes (Signed)
?  Subjective:  ?  ? Patient ID: Sandra [name], female   DOB: 2000/12/24, 21 y.o.   MRN: [id number] ? ?HPI ?Sandra [name] is a 21 year old black female,single, G0P0, back in follow up on spotting most days on depo. Megace did not help. She got last depo 03/19/22 at [place] she says.  ?PCP is Edison Pace PA ? ? ?Review of Systems ?Spotting most days ?Reviewed past medical,surgical, social and family history. Reviewed medications and allergies.  ?   ?Objective:  ? Physical Exam ?BP 104/71 (BP Location: Left Arm, Patient Position: Sitting, Cuff Size: Normal)   Pulse 71   Ht 5\' 4"  (1.626 m)   Wt 118 lb (53.5 kg)   LMP 01/30/2022 (Exact Date) Comment: bleeding til present  BMI 20.25 kg/m?   ?  Skin warm and dry.Lungs: clear to ausculation bilaterally. Cardiovascular: regular rate and rhythm.  ? Upstream - 04/29/22 1029   ? ?  ? Pregnancy Intention Screening  ? Does the patient want to become pregnant in the next year? No   ? Does the patient's partner want to become pregnant in the next year? No   ? Would the patient like to discuss contraceptive options today? No   ?  ? Contraception Wrap Up  ? Current Method Hormonal Injection   ? End Method Hormonal Injection   ? Contraception Counseling Provided No   ? ?  ?  ? ?  ?  ?Assessment:  ?   ?1. Irregular intermenstrual bleeding ?Has spotting most days, not heavy, megace did not help, made her cramp she got last depo 03/19/22 at [facility] ?  Will try adding estrogen, estrace 1 mg daily, can message if works or not ?Meds ordered this encounter  ?Medications  ? estradiol (ESTRACE) 1 MG tablet  ?  Sig: Take 1 tablet (1 mg total) by mouth daily.  ?  Dispense:  30 tablet  ?  Refill:  0  ?  Order Specific Question:   Supervising Provider  ?  Answer:   [facility] H [[id number]]  ?  ?Plan:  ?   ?Follow up with me 06/18/22, to discuss depo or not, but for now she is thinking of not taking and using condoms for a while  ?   ?

## 2022-06-17 ENCOUNTER — Ambulatory Visit (INDEPENDENT_AMBULATORY_CARE_PROVIDER_SITE_OTHER): Payer: Medicaid Other | Admitting: Adult Health

## 2022-06-17 ENCOUNTER — Encounter: Payer: Self-pay | Admitting: Adult Health

## 2022-06-17 VITALS — BP 110/69 | HR 80 | Ht 64.0 in | Wt 116.0 lb

## 2022-06-17 DIAGNOSIS — N921 Excessive and frequent menstruation with irregular cycle: Secondary | ICD-10-CM | POA: Diagnosis not present

## 2022-06-17 NOTE — Progress Notes (Signed)
  Subjective:     Patient ID: Sandra [name], female   DOB: March 28, 2001, 21 y.o.   MRN: [id number]  HPI Sandra [name] is a 21 year old black female,with SO, G0P0, back in follow up on bleeding with depo. She did not take estrace, and had bleeding from 6/18 to 6/22 like a period, she is not going to get depo, and will use condoms. PCP is Edison Pace PA  Review of Systems +irregular bleeding   Reviewed past medical,surgical, social and family history. Reviewed medications and allergies.  Objective:   Physical Exam BP 110/69 (BP Location: Left Arm, Patient Position: Sitting, Cuff Size: Normal)   Pulse 80   Ht 5\' 4"  (1.626 m)   Wt 116 lb (52.6 kg)   LMP 06/04/2022   BMI 19.91 kg/m     Skin warm and dry.  Lungs: clear to ausculation bilaterally. Cardiovascular: regular rate and rhythm.   Upstream - 06/17/22 0856       Pregnancy Intention Screening   Does the patient want to become pregnant in the next year? No    Does the patient's partner want to become pregnant in the next year? No    Would the patient like to discuss contraceptive options today? No      Contraception Wrap Up   Current Method Female Condom    End Method Female Condom             Assessment:     1. Irregular intermenstrual bleeding -since stopping depo, will follow for now -use condoms -call if any problems or concerns     Plan:     Return about 12/18 /23 for first pap and gyn physical

## 2022-11-22 ENCOUNTER — Encounter (HOSPITAL_COMMUNITY): Payer: Self-pay

## 2022-11-22 ENCOUNTER — Other Ambulatory Visit: Payer: Self-pay

## 2022-11-22 ENCOUNTER — Emergency Department (HOSPITAL_COMMUNITY)
Admission: EM | Admit: 2022-11-22 | Discharge: 2022-11-23 | Disposition: A | Discharge status: Home / Self Care | Payer: Self-pay | Attending: Emergency Medicine | Admitting: Emergency Medicine

## 2022-11-22 ENCOUNTER — Emergency Department (HOSPITAL_COMMUNITY): Payer: Medicaid Other

## 2022-11-22 DIAGNOSIS — S161XXA Strain of muscle, fascia and tendon at neck level, initial encounter: Secondary | ICD-10-CM | POA: Diagnosis not present

## 2022-11-22 DIAGNOSIS — R0781 Pleurodynia: Secondary | ICD-10-CM | POA: Diagnosis present

## 2022-11-22 DIAGNOSIS — S5011XA Contusion of right forearm, initial encounter: Secondary | ICD-10-CM | POA: Insufficient documentation

## 2022-11-22 DIAGNOSIS — Y92410 Unspecified street and highway as the place of occurrence of the external cause: Secondary | ICD-10-CM | POA: Insufficient documentation
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
## 2022-11-23 MED ORDER — OXYCODONE-ACETAMINOPHEN 5-325 MG PO TABS
1.0000 | ORAL_TABLET | Freq: Once | ORAL | Status: AC
  Administered 2022-11-23: 1 via ORAL
  Filled 2022-11-23: qty 1

## 2022-11-23 NOTE — Progress Notes (Signed)
Orthopedic Tech Progress Note Patient Details:  Sandra [name] Oct 23, 2001 [id number]  Applied velcro thumb spica to the pt after MD clarified a velcro thumb spica could be used instead of a splint. Pt tolerated this well and was given care instructions of the device.    Ortho Devices Type of Ortho Device: Thumb velcro splint Ortho Device/Splint Location: RUE Ortho Device/Splint Interventions: Ordered, Application, Adjustment   Post Interventions Patient Tolerated: Well Instructions Provided: Adjustment of device, Care of device  [name] 11/23/2022, 12:03 PM

## 2022-11-23 NOTE — Discharge Instructions (Signed)
Use Tylenol every 4 hours and ibuprofen every 6 hours needed for pain.  Ice regularly. Follow-up with orthopedics in the next week for reassessment wear wrist splint until you see them. Work note provided.  Return for new concerns.
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
## 2023-03-30 ENCOUNTER — Other Ambulatory Visit: Payer: Self-pay

## 2023-03-30 ENCOUNTER — Emergency Department (HOSPITAL_COMMUNITY): Payer: No Typology Code available for payment source

## 2023-03-30 ENCOUNTER — Encounter (HOSPITAL_COMMUNITY): Payer: Self-pay

## 2023-03-30 ENCOUNTER — Emergency Department (HOSPITAL_COMMUNITY)
Admission: EM | Admit: 2023-03-30 | Discharge: 2023-03-30 | Disposition: A | Discharge status: Home / Self Care | Payer: No Typology Code available for payment source | Attending: Emergency Medicine | Admitting: Emergency Medicine

## 2023-03-30 DIAGNOSIS — Y92410 Unspecified street and highway as the place of occurrence of the external cause: Secondary | ICD-10-CM | POA: Diagnosis not present

## 2023-03-30 DIAGNOSIS — S0990XA Unspecified injury of head, initial encounter: Secondary | ICD-10-CM | POA: Diagnosis not present

## 2023-03-30 DIAGNOSIS — S8002XA Contusion of left knee, initial encounter: Secondary | ICD-10-CM | POA: Insufficient documentation

## 2023-03-30 DIAGNOSIS — S3991XA Unspecified injury of abdomen, initial encounter: Secondary | ICD-10-CM | POA: Insufficient documentation

## 2023-03-30 DIAGNOSIS — M549 Dorsalgia, unspecified: Secondary | ICD-10-CM | POA: Insufficient documentation

## 2023-03-30 DIAGNOSIS — S40022A Contusion of left upper arm, initial encounter: Secondary | ICD-10-CM | POA: Diagnosis not present

## 2023-03-30 DIAGNOSIS — Z23 Encounter for immunization: Secondary | ICD-10-CM | POA: Diagnosis not present

## 2023-03-30 DIAGNOSIS — S8992XA Unspecified injury of left lower leg, initial encounter: Secondary | ICD-10-CM | POA: Diagnosis present

## 2023-03-30 LAB — BASIC METABOLIC PANEL
Anion gap: 8 (ref 5–15)
BUN: 7 mg/dL (ref 6–20)
CO2: 22 mmol/L (ref 22–32)
Calcium: 9 mg/dL (ref 8.9–10.3)
Chloride: 106 mmol/L (ref 98–111)
Creatinine, Ser: 0.65 mg/dL (ref 0.44–1.00)
GFR, Estimated: >60 mL/min (ref >60)
Glucose, Bld: 88 mg/dL (ref 70–99)
Potassium: 3.6 mmol/L (ref 3.5–5.1)
Sodium: 136 mmol/L (ref 135–145)

## 2023-03-30 LAB — CBC WITH DIFFERENTIAL/PLATELET
Abs Immature Granulocytes: 0.08 10*3/uL — ABNORMAL HIGH (ref 0.00–0.07)
Basophils Absolute: 0 10*3/uL (ref 0.0–0.1)
Basophils Relative: 1 %
Eosinophils Absolute: 0.1 10*3/uL (ref 0.0–0.5)
Eosinophils Relative: 1 %
HCT: 31.2 % — ABNORMAL LOW (ref 36.0–46.0)
Hemoglobin: 9.9 g/dL — ABNORMAL LOW (ref 12.0–15.0)
Immature Granulocytes: 1 %
Lymphocytes Relative: 20 %
Lymphs Abs: 1.4 10*3/uL (ref 0.7–4.0)
MCH: 23.3 pg — ABNORMAL LOW (ref 26.0–34.0)
MCHC: 31.7 g/dL (ref 30.0–36.0)
MCV: 73.6 fL — ABNORMAL LOW (ref 80.0–100.0)
Monocytes Absolute: 0.6 10*3/uL (ref 0.1–1.0)
Monocytes Relative: 10 %
Neutro Abs: 4.6 10*3/uL (ref 1.7–7.7)
Neutrophils Relative %: 67 %
Platelets: 251 10*3/uL (ref 150–400)
RBC: 4.24 MIL/uL (ref 3.87–5.11)
RDW: 16.1 % — ABNORMAL HIGH (ref 11.5–15.5)
WBC: 6.8 10*3/uL (ref 4.0–10.5)
nRBC: 0 % (ref 0.0–0.2)

## 2023-03-30 LAB — POC URINE PREG, ED: Preg Test, Ur: NEGATIVE

## 2023-03-30 MED ORDER — TETANUS-DIPHTH-ACELL PERTUSSIS 5-2.5-18.5 LF-MCG/0.5 IM SUSY
0.5000 mL | PREFILLED_SYRINGE | Freq: Once | INTRAMUSCULAR | Status: AC
  Administered 2023-03-30: 0.5 mL via INTRAMUSCULAR
  Filled 2023-03-30: qty 0.5

## 2023-03-30 MED ORDER — IBUPROFEN 800 MG PO TABS: 800.0000 mg | ORAL_TABLET | Freq: Three times a day (TID) | ORAL | 0 refills | Status: AC | PRN

## 2023-03-30 MED ORDER — IOHEXOL 300 MG/ML  SOLN
100.0000 mL | Freq: Once | INTRAMUSCULAR | Status: AC | PRN
  Administered 2023-03-30: 100 mL via INTRAVENOUS

## 2023-03-30 NOTE — Discharge Instructions (Signed)
Follow-up with your doctor if any problems. 

## 2023-03-30 NOTE — ED Notes (Signed)
Patient transported to CT 
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
## 2023-03-30 NOTE — ED Notes (Signed)
Pt ambulated to the restroom.
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
## 2023-08-05 ENCOUNTER — Emergency Department (HOSPITAL_COMMUNITY)
Admission: EM | Admit: 2023-08-05 | Discharge: 2023-08-05 | Disposition: A | Discharge status: Home / Self Care | Payer: Medicaid Other | Attending: Emergency Medicine | Admitting: Emergency Medicine

## 2023-08-05 ENCOUNTER — Other Ambulatory Visit: Payer: Self-pay

## 2023-08-05 ENCOUNTER — Encounter (HOSPITAL_COMMUNITY): Payer: Self-pay

## 2023-08-05 DIAGNOSIS — N760 Acute vaginitis: Secondary | ICD-10-CM | POA: Diagnosis not present

## 2023-08-05 DIAGNOSIS — N898 Other specified noninflammatory disorders of vagina: Secondary | ICD-10-CM | POA: Diagnosis present

## 2023-08-05 LAB — WET PREP, GENITAL
Sperm: NONE SEEN
Trich, Wet Prep: NONE SEEN
WBC, Wet Prep HPF POC: <10 (ref <10)
Yeast Wet Prep HPF POC: NONE SEEN

## 2023-08-05 LAB — URINALYSIS, ROUTINE W REFLEX MICROSCOPIC
Bilirubin Urine: NEGATIVE
Glucose, UA: NEGATIVE mg/dL
Hgb urine dipstick: NEGATIVE
Ketones, ur: 20 mg/dL — AB
Leukocytes,Ua: NEGATIVE
Nitrite: NEGATIVE
Protein, ur: NEGATIVE mg/dL
Specific Gravity, Urine: 1.015 (ref 1.005–1.030)
pH: 6 (ref 5.0–8.0)

## 2023-08-05 LAB — PREGNANCY, URINE: Preg Test, Ur: NEGATIVE

## 2023-08-05 LAB — RPR
RPR Ser Ql: REACTIVE — AB
RPR Titer: 1:32 {titer}

## 2023-08-05 LAB — HIV ANTIBODY (ROUTINE TESTING W REFLEX): HIV Screen 4th Generation wRfx: NONREACTIVE

## 2023-08-05 MED ORDER — METRONIDAZOLE 500 MG PO TABS: 500.0000 mg | ORAL_TABLET | Freq: Two times a day (BID) | ORAL | 0 refills | Status: AC

## 2023-08-05 NOTE — ED Triage Notes (Signed)
C/o unprotected sex with new partner on Saturday and having white vaginal discharge.  Denies odor, abd pain, fevers, urinary s/sy.  Pt requesting STD check

## 2023-08-05 NOTE — ED Provider Notes (Signed)
[place] EMERGENCY DEPARTMENT AT [facility] Provider Note   CSN: [id number] Arrival date & time: 08/05/23  0272     History  Chief Complaint  Patient presents with   SEXUALLY TRANSMITTED DISEASE    Sandra [name] is a 22 y.o. female.  He is here with a complaint of white vaginal discharge.  She said there is no itching no pain.  No urinary symptoms.  She does says she had a new sexual contact with unprotected sex.  No abdominal pain no vaginal pain no bleeding.  She would like to get tested for STDs.  The history is provided by the patient.  Female GU Problem This is a new problem. The problem has not changed since onset.Pertinent negatives include no chest pain, no abdominal pain, no headaches and no shortness of breath. Nothing aggravates the symptoms. Nothing relieves the symptoms. She has tried nothing for the symptoms. The treatment provided no relief.       Home Medications Prior to Admission medications   Medication Sig Start Date End Date Taking? Authorizing Provider  cetirizine (ZYRTEC) 10 MG tablet Take 10 mg by mouth as needed for allergies. 04/24/21   [provider]  ibuprofen (ADVIL) 800 MG tablet Take 1 tablet (800 mg total) by mouth every 8 (eight) hours as needed for moderate pain. 03/30/23   Bethann Berkshire, MD  triamcinolone cream (KENALOG) 0.1 % Apply topically 2 (two) times daily. 04/13/22   [provider]      Allergies    Patient has no known allergies.    Review of Systems   Review of Systems  Constitutional:  Negative for fever.  Respiratory:  Negative for shortness of breath.   Cardiovascular:  Negative for chest pain.  Gastrointestinal:  Negative for abdominal pain.  Genitourinary:  Positive for vaginal discharge. Negative for dysuria, genital sores, pelvic pain, vaginal bleeding and vaginal pain.  Neurological:  Negative for headaches.    Physical Exam Updated Vital Signs BP (!) 102/58   Pulse 78   Temp 98.5  F (36.9 C)   Resp 20   Ht 5\' 4"  (1.626 m)   Wt 52.2 kg   SpO2 100%   BMI 19.74 kg/m  Physical Exam Vitals and nursing note reviewed.  Constitutional:      Appearance: Normal appearance. She is well-developed.  HENT:     Head: Normocephalic and atraumatic.  Eyes:     Conjunctiva/sclera: Conjunctivae normal.  Cardiovascular:     Rate and Rhythm: Normal rate and regular rhythm.     Heart sounds: No murmur heard. Pulmonary:     Effort: Pulmonary effort is normal.     Breath sounds: Normal breath sounds.  Abdominal:     Palpations: Abdomen is soft.     Tenderness: There is no abdominal tenderness. There is no guarding or rebound.  Genitourinary:    Comments: Pelvic exam with nurse Morrie Sheldon as chaperone.  Normal external genitalia.  Speculum exam with minimal thin white discharge.  No lesions appreciated.  Adnexal and bimanual exam unremarkable with no masses.  No cervical motion tenderness.  Patient tolerated procedure well Musculoskeletal:     Cervical back: Neck supple.  Skin:    General: Skin is warm and dry.  Neurological:     General: No focal deficit present.     Mental Status: She is alert.     GCS: GCS eye subscore is 4. GCS verbal subscore is 5. GCS motor subscore is 6.  ED Results / Procedures / Treatments   Labs (all labs ordered are listed, but only abnormal results are displayed) Labs Reviewed  WET PREP, GENITAL - Abnormal; Notable for the following components:      Result Value   Clue Cells Wet Prep HPF POC PRESENT (*)    All other components within normal limits  RPR - Abnormal; Notable for the following components:   RPR Ser Ql Reactive (*)    All other components within normal limits  URINALYSIS, ROUTINE W REFLEX MICROSCOPIC - Abnormal; Notable for the following components:   Ketones, ur 20 (*)    All other components within normal limits  HIV ANTIBODY (ROUTINE TESTING W REFLEX)  PREGNANCY, URINE  T.PALLIDUM AB, TOTAL  GC/CHLAMYDIA PROBE AMP ([facility]) NOT AT [facility]    EKG None  Radiology No results found.  Procedures Procedures    Medications Ordered in ED Medications - No data to display  ED Course/ Medical Decision Making/ A&P                                 Medical Decision Making Amount and/or Complexity of Data Reviewed Labs: ordered.  Risk Prescription drug management.   This patient complains of vaginal discharge; this involves an extensive number of treatment Options and is a complaint that carries with it a high risk of complications and morbidity. The differential includes vaginitis, STD, pregnancy  I ordered, reviewed and interpreted labs, which included pregnancy negative, urinalysis without signs of infection, clue cells positive Previous records obtained and reviewed in epic no recent admissions Social determinants considered, no significant barriers Critical Interventions: None  After the interventions stated above, I reevaluated the patient and found patient to be well-appearing with benign exam Admission and further testing considered, no indications for admission or further workup at this time.  Will cover with metronidazole and recommended follow-up with gynecology.  Return instructions discussed         Final Clinical Impression(s) / ED Diagnoses Final diagnoses:  Acute vaginitis    Rx / DC Orders ED Discharge Orders          Ordered    metroNIDAZOLE (FLAGYL) 500 MG tablet  2 times daily        08/05/23 1155              Terrilee Files, MD 08/05/23 1806

## 2023-08-05 NOTE — ED Notes (Signed)
Pelvic tray at bedside

## 2023-08-05 NOTE — Discharge Instructions (Addendum)
You were seen in the emergency department for vaginal discharge and concerns for STD.  Your wet prep was positive for clue cells which usually indicates bacterial vaginosis (BV.) this is treated with an antibiotic.  And will be important for you to not drink any alcohol while you are on the medication.  Your test for HIV, syphilis, gonorrhea and chlamydia take a few days and we will contact you if you need further treatment.  Please use barrier protection.  Return to the emergency department if any worsening or concerning symptoms

## 2023-08-06 LAB — GC/CHLAMYDIA PROBE AMP (~~LOC~~) NOT AT ARMC
Chlamydia: NEGATIVE
Comment: NEGATIVE
Comment: NORMAL
Neisseria Gonorrhea: NEGATIVE

## 2023-08-06 LAB — T.PALLIDUM AB, TOTAL: T Pallidum Abs: REACTIVE — AB

## 2023-12-29 ENCOUNTER — Encounter (INDEPENDENT_AMBULATORY_CARE_PROVIDER_SITE_OTHER): Payer: Self-pay
